# Patient Record
Sex: Female | Born: 1995 | Race: White | Hispanic: No | Marital: Married | Smoking: Never smoker
Health system: Southern US, Community
[De-identification: ages and names within clinical notes are randomized; demographics above are authoritative.]

## PROBLEM LIST (undated history)

## (undated) DIAGNOSIS — Z789 Other specified health status: Secondary | ICD-10-CM

## (undated) DIAGNOSIS — S060X9A Concussion with loss of consciousness of unspecified duration, initial encounter: Secondary | ICD-10-CM

## (undated) DIAGNOSIS — O139 Gestational [pregnancy-induced] hypertension without significant proteinuria, unspecified trimester: Secondary | ICD-10-CM

## (undated) DIAGNOSIS — O1420 HELLP syndrome (HELLP), unspecified trimester: Secondary | ICD-10-CM

## (undated) DIAGNOSIS — B019 Varicella without complication: Secondary | ICD-10-CM

## (undated) DIAGNOSIS — S060XAA Concussion with loss of consciousness status unknown, initial encounter: Secondary | ICD-10-CM

## (undated) HISTORY — DX: Other specified health status: Z78.9

## (undated) HISTORY — PX: TONSILLECTOMY: SUR1361

## (undated) HISTORY — DX: Gestational (pregnancy-induced) hypertension without significant proteinuria, unspecified trimester: O13.9

## (undated) HISTORY — DX: Varicella without complication: B01.9

---
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
## 2020-03-22 ENCOUNTER — Other Ambulatory Visit: Payer: Self-pay

## 2020-03-22 DIAGNOSIS — Z20822 Contact with and (suspected) exposure to covid-19: Secondary | ICD-10-CM

## 2020-03-23 LAB — SARS-COV-2, NAA 2 DAY TAT

## 2020-03-23 LAB — NOVEL CORONAVIRUS, NAA: SARS-CoV-2, NAA: DETECTED — AB

## 2020-07-30 NOTE — L&D Delivery Note (Addendum)
Delivery Note At 8:55 PM a non-viable female was delivered via Vaginal, Spontaneous (Presentation: breech) and en caul.  APGAR: 0, 0; weight pending.   Placenta status: Spontaneous;Pathology, Intact.  Cord:   with the following complications:  marginal insertion.  Cord pH: n/a.  Inspection of fetus revealed no abnormality.  Amniotic fluid translucent, light green in color.  Anesthesia: IV fentanyl and NO Episiotomy: None Lacerations:  none Suture Repair:  n/a Est. Blood Loss (mL):  50 cc  Mom to postpartum.  Baby to [place].  Will start magnesium sulfate recovery x 24 hours postpartum.  Continue q 6 h labs Patient given Amp/Gent x 1 dose; although suspect fever related to large doses of misoprostol.  Mitchel Honour 03/25/2021, 9:20 PM

## 2021-01-19 ENCOUNTER — Encounter: Payer: Self-pay | Admitting: Obstetrics and Gynecology

## 2021-01-27 ENCOUNTER — Other Ambulatory Visit: Payer: Self-pay | Admitting: Obstetrics and Gynecology

## 2021-02-28 ENCOUNTER — Encounter: Payer: Self-pay | Admitting: *Deleted

## 2021-03-01 ENCOUNTER — Ambulatory Visit (HOSPITAL_BASED_OUTPATIENT_CLINIC_OR_DEPARTMENT_OTHER): Payer: 59 | Admitting: Maternal & Fetal Medicine

## 2021-03-01 ENCOUNTER — Ambulatory Visit: Payer: 59 | Attending: Obstetrics and Gynecology

## 2021-03-01 ENCOUNTER — Ambulatory Visit: Payer: 59 | Admitting: *Deleted

## 2021-03-01 ENCOUNTER — Encounter: Payer: Self-pay | Admitting: Obstetrics and Gynecology

## 2021-03-01 ENCOUNTER — Encounter: Payer: Self-pay | Admitting: *Deleted

## 2021-03-01 ENCOUNTER — Other Ambulatory Visit: Payer: Self-pay | Admitting: Obstetrics and Gynecology

## 2021-03-01 ENCOUNTER — Other Ambulatory Visit: Payer: Self-pay

## 2021-03-01 ENCOUNTER — Other Ambulatory Visit: Payer: Self-pay | Admitting: *Deleted

## 2021-03-01 VITALS — BP 115/72 | HR 79 | Ht 65.0 in

## 2021-03-01 DIAGNOSIS — Z364 Encounter for antenatal screening for fetal growth retardation: Secondary | ICD-10-CM

## 2021-03-01 DIAGNOSIS — Z363 Encounter for antenatal screening for malformations: Secondary | ICD-10-CM | POA: Diagnosis not present

## 2021-03-01 DIAGNOSIS — O365920 Maternal care for other known or suspected poor fetal growth, second trimester, not applicable or unspecified: Secondary | ICD-10-CM

## 2021-03-01 DIAGNOSIS — O283 Abnormal ultrasonic finding on antenatal screening of mother: Secondary | ICD-10-CM

## 2021-03-01 DIAGNOSIS — Z3689 Encounter for other specified antenatal screening: Secondary | ICD-10-CM | POA: Diagnosis present

## 2021-03-01 DIAGNOSIS — Z3A19 19 weeks gestation of pregnancy: Secondary | ICD-10-CM

## 2021-03-01 DIAGNOSIS — O358XX0 Maternal care for other (suspected) fetal abnormality and damage, not applicable or unspecified: Secondary | ICD-10-CM

## 2021-03-01 DIAGNOSIS — O43192 Other malformation of placenta, second trimester: Secondary | ICD-10-CM | POA: Diagnosis not present

## 2021-03-01 NOTE — Progress Notes (Signed)
MFM Consultation  Brenda Mason is a 25 yo G1P0 who is here with single intrauterine pregnancy here for a detailed anatomy due new previable fetal growth restriction.  Normal anatomy with measurements consistent with fetal growth restriction with an EFW 1.1 and AC of 7% There is good fetal movement and amniotic fluid volume Suboptimal views of the fetal anatomy were obtained secondary to fetal position. The UA Dopplers demonstrate persistent AEDF no evidence of REDF.  I discussed today's visit with a diagnosis of IUGR. I explained that the etiology includes placental insufficiency, chronic disease, infection, aneuploidy and other genetic syndromes. She has a low risk NIPS. She has no additional risk factors for chronic disease or vascular disease. At this time I explained the diagnosis, evaluation and management to include on going fetal growth and weekly antenatal testing to include UA Dopplers.   However given the early gestational age and the abnormal UA Dopplers we discussed the increased risk for UA Doppler progression resulting in a previable stillbirth. We discussed the increased risk for preterm delivery and the associated preterm comorbidites at early gestational age including neurodevelopmental delays and chronic disease.   We will see her in 2 weeks to assess fetal heart rate and to complete the fetal anatomy. In 4 weeks we will repeat growth and evaluate the UA Dopplers and weight. She will be 23 weeks and will be the first option for BMZ administration and NICU consultation. I discussed this is all dependent on the weight of the fetus preferably above 400-500g  Brenda Mason has no contributing factors for today's findings, however, I did explain we at times see this in women with undiagnosed antiphospholipid antibody syndrome. We discussed the treatment of heparin and ASA in these cases. We discussed if there would be any benefit for low dose ASA. I explained that the studies have not demonstrated  benefit in early FGR however, ASA has been helpful for early preterm delivery and preeclampsia. She opted to take low dose ASA. She affirmed no allergies.  In addition, there was suspicion for echogenic bowel but the findings were not bright as bone, we discussed the association with CMV and other infection and genetic etiologies. We discussed the indication for an amniocentesis to rule out genetic causes. Given the 1:500-1:1000 risk  of perinatal loss she declined diagnostic testing but will consider at a future appointment. Understandably today' s visit was challenging and she desired to discuss with her husband before making any other decisions.  I provided my personal number if she or her husband had additional questions.  At next appointment we will drawn CMV, complete the fetal anatomy and forgo UA Dopplers unless Brenda Mason request ( We discussed not looking until we can act on them).   I spent 60 minutes with >50% in face to face consultation.  Brenda Olive, MD.

## 2021-03-20 ENCOUNTER — Ambulatory Visit: Payer: 59

## 2021-03-20 ENCOUNTER — Encounter: Payer: Self-pay | Admitting: *Deleted

## 2021-03-20 ENCOUNTER — Other Ambulatory Visit: Payer: Self-pay | Admitting: Maternal & Fetal Medicine

## 2021-03-20 ENCOUNTER — Other Ambulatory Visit: Payer: Self-pay

## 2021-03-20 ENCOUNTER — Ambulatory Visit: Payer: 59 | Attending: Obstetrics and Gynecology

## 2021-03-20 ENCOUNTER — Ambulatory Visit: Payer: 59 | Admitting: *Deleted

## 2021-03-20 VITALS — BP 132/81 | HR 98

## 2021-03-20 DIAGNOSIS — O365920 Maternal care for other known or suspected poor fetal growth, second trimester, not applicable or unspecified: Secondary | ICD-10-CM | POA: Diagnosis not present

## 2021-03-20 DIAGNOSIS — O283 Abnormal ultrasonic finding on antenatal screening of mother: Secondary | ICD-10-CM | POA: Diagnosis not present

## 2021-03-20 DIAGNOSIS — O43199 Other malformation of placenta, unspecified trimester: Secondary | ICD-10-CM

## 2021-03-20 DIAGNOSIS — Z3A21 21 weeks gestation of pregnancy: Secondary | ICD-10-CM | POA: Insufficient documentation

## 2021-03-22 ENCOUNTER — Other Ambulatory Visit: Payer: Self-pay

## 2021-03-22 ENCOUNTER — Telehealth: Payer: Self-pay | Admitting: Obstetrics and Gynecology

## 2021-03-22 LAB — TOXOPLASMA GONDII ANTIBODY, IGG: Toxoplasma IgG Ratio: <3 IU/mL (ref 0.0–7.1)

## 2021-03-22 LAB — AFP, SERUM, OPEN SPINA BIFIDA
AFP MoM: 1.47
AFP Value: 103.9 ng/mL
Gest. Age on Collection Date: 21.5 weeks
Maternal Age At EDD: 23.8 yr
OSBR Risk 1 IN: 2965
Test Results:: NEGATIVE
Weight: 155 [lb_av]

## 2021-03-22 LAB — CMV IGM: CMV IgM Ser EIA-aCnc: <30 AU/mL (ref 0.0–29.9)

## 2021-03-22 LAB — TOXOPLASMA GONDII ANTIBODY, IGM: Toxoplasma Antibody- IgM: <3 AU/mL (ref 0.0–7.9)

## 2021-03-22 LAB — INFECT DISEASE AB IGM REFLEX 1

## 2021-03-22 LAB — CMV ANTIBODY, IGG (EIA): CMV Ab - IgG: <0.6 U/mL (ref 0.00–0.59)

## 2021-03-22 NOTE — Telephone Encounter (Signed)
Brenda Mason elected to have maternal serum AFP only screening for open neural tube defects.  The results of this screening are within normal limits with a resulting risk for open spina bifida of 1 in 2,965.  This testing detects greater than 80% of open neural tube defects and when combined with second trimester ultrasound the detection is further increased. It is important to remember that not all birth defects can be detected prenatally.  In addition, testing for CMV and toxoplasmosis were ordered due to the findings of IUGR and echogenic bowel on ultrasound.  These results were negative for both IgG and IgM, showing no evidence of recent or past exposure to either of these illnesses. Prior CF carrier screening was negative per patient.  We may be reached at [phone] with any questions or concerns.  Cherly Anderson, MS, CGC

## 2021-03-24 ENCOUNTER — Inpatient Hospital Stay (HOSPITAL_COMMUNITY)
Admission: AD | Admit: 2021-03-24 | Discharge: 2021-03-27 | DRG: 806 | Disposition: A | Discharge status: Home / Self Care | Payer: 59 | Attending: Obstetrics & Gynecology | Admitting: Obstetrics & Gynecology

## 2021-03-24 ENCOUNTER — Observation Stay (HOSPITAL_BASED_OUTPATIENT_CLINIC_OR_DEPARTMENT_OTHER): Payer: 59

## 2021-03-24 ENCOUNTER — Encounter (HOSPITAL_COMMUNITY): Payer: Self-pay | Admitting: Obstetrics and Gynecology

## 2021-03-24 ENCOUNTER — Inpatient Hospital Stay (HOSPITAL_COMMUNITY): Payer: 59

## 2021-03-24 ENCOUNTER — Other Ambulatory Visit: Payer: Self-pay

## 2021-03-24 DIAGNOSIS — M5489 Other dorsalgia: Secondary | ICD-10-CM | POA: Diagnosis not present

## 2021-03-24 DIAGNOSIS — O365920 Maternal care for other known or suspected poor fetal growth, second trimester, not applicable or unspecified: Secondary | ICD-10-CM

## 2021-03-24 DIAGNOSIS — Z20822 Contact with and (suspected) exposure to covid-19: Secondary | ICD-10-CM | POA: Diagnosis present

## 2021-03-24 DIAGNOSIS — R932 Abnormal findings on diagnostic imaging of liver and biliary tract: Secondary | ICD-10-CM

## 2021-03-24 DIAGNOSIS — R1013 Epigastric pain: Secondary | ICD-10-CM | POA: Diagnosis present

## 2021-03-24 DIAGNOSIS — D696 Thrombocytopenia, unspecified: Secondary | ICD-10-CM | POA: Diagnosis present

## 2021-03-24 DIAGNOSIS — O1424 HELLP syndrome, complicating childbirth: Principal | ICD-10-CM | POA: Diagnosis present

## 2021-03-24 DIAGNOSIS — Z3A22 22 weeks gestation of pregnancy: Secondary | ICD-10-CM | POA: Diagnosis not present

## 2021-03-24 DIAGNOSIS — R1010 Upper abdominal pain, unspecified: Secondary | ICD-10-CM | POA: Diagnosis not present

## 2021-03-24 DIAGNOSIS — O321XX0 Maternal care for breech presentation, not applicable or unspecified: Secondary | ICD-10-CM

## 2021-03-24 DIAGNOSIS — O1422 HELLP syndrome (HELLP), second trimester: Secondary | ICD-10-CM | POA: Diagnosis not present

## 2021-03-24 DIAGNOSIS — O364XX0 Maternal care for intrauterine death, not applicable or unspecified: Secondary | ICD-10-CM | POA: Diagnosis present

## 2021-03-24 DIAGNOSIS — O283 Abnormal ultrasonic finding on antenatal screening of mother: Secondary | ICD-10-CM | POA: Diagnosis not present

## 2021-03-24 DIAGNOSIS — O358XX0 Maternal care for other (suspected) fetal abnormality and damage, not applicable or unspecified: Secondary | ICD-10-CM

## 2021-03-24 DIAGNOSIS — O43192 Other malformation of placenta, second trimester: Secondary | ICD-10-CM

## 2021-03-24 DIAGNOSIS — O43123 Velamentous insertion of umbilical cord, third trimester: Secondary | ICD-10-CM | POA: Diagnosis present

## 2021-03-24 DIAGNOSIS — R7401 Elevation of levels of liver transaminase levels: Secondary | ICD-10-CM

## 2021-03-24 DIAGNOSIS — O9912 Other diseases of the blood and blood-forming organs and certain disorders involving the immune mechanism complicating childbirth: Secondary | ICD-10-CM | POA: Diagnosis present

## 2021-03-24 DIAGNOSIS — M549 Dorsalgia, unspecified: Secondary | ICD-10-CM

## 2021-03-24 DIAGNOSIS — O99891 Other specified diseases and conditions complicating pregnancy: Secondary | ICD-10-CM

## 2021-03-24 DIAGNOSIS — O0990 Supervision of high risk pregnancy, unspecified, unspecified trimester: Secondary | ICD-10-CM

## 2021-03-24 DIAGNOSIS — R1011 Right upper quadrant pain: Secondary | ICD-10-CM

## 2021-03-24 DIAGNOSIS — O1420 HELLP syndrome (HELLP), unspecified trimester: Secondary | ICD-10-CM | POA: Diagnosis present

## 2021-03-24 DIAGNOSIS — O99112 Other diseases of the blood and blood-forming organs and certain disorders involving the immune mechanism complicating pregnancy, second trimester: Secondary | ICD-10-CM

## 2021-03-24 LAB — CBC WITH DIFFERENTIAL/PLATELET
Abs Immature Granulocytes: 0.22 10*3/uL — ABNORMAL HIGH (ref 0.00–0.07)
Basophils Absolute: 0 10*3/uL (ref 0.0–0.1)
Basophils Relative: 0 %
Eosinophils Absolute: 0.1 10*3/uL (ref 0.0–0.5)
Eosinophils Relative: 1 %
HCT: 34 % — ABNORMAL LOW (ref 36.0–46.0)
Hemoglobin: 12.3 g/dL (ref 12.0–15.0)
Immature Granulocytes: 2 %
Lymphocytes Relative: 16 %
Lymphs Abs: 1.9 10*3/uL (ref 0.7–4.0)
MCH: 32.6 pg (ref 26.0–34.0)
MCHC: 36.2 g/dL — ABNORMAL HIGH (ref 30.0–36.0)
MCV: 90.2 fL (ref 80.0–100.0)
Monocytes Absolute: 0.8 10*3/uL (ref 0.1–1.0)
Monocytes Relative: 7 %
Neutro Abs: 8.9 10*3/uL — ABNORMAL HIGH (ref 1.7–7.7)
Neutrophils Relative %: 74 %
Platelets: 88 10*3/uL — ABNORMAL LOW (ref 150–400)
RBC: 3.77 MIL/uL — ABNORMAL LOW (ref 3.87–5.11)
RDW: 12.2 % (ref 11.5–15.5)
WBC: 11.8 10*3/uL — ABNORMAL HIGH (ref 4.0–10.5)
nRBC: 0 % (ref 0.0–0.2)

## 2021-03-24 LAB — COMPREHENSIVE METABOLIC PANEL
ALT: 262 U/L — ABNORMAL HIGH (ref 0–44)
ALT: 291 U/L — ABNORMAL HIGH (ref 0–44)
ALT: 299 U/L — ABNORMAL HIGH (ref 0–44)
ALT: 318 U/L — ABNORMAL HIGH (ref 0–44)
ALT: 379 U/L — ABNORMAL HIGH (ref 0–44)
AST: 179 U/L — ABNORMAL HIGH (ref 15–41)
AST: 219 U/L — ABNORMAL HIGH (ref 15–41)
AST: 232 U/L — ABNORMAL HIGH (ref 15–41)
AST: 250 U/L — ABNORMAL HIGH (ref 15–41)
AST: 306 U/L — ABNORMAL HIGH (ref 15–41)
Albumin: 3.1 g/dL — ABNORMAL LOW (ref 3.5–5.0)
Albumin: 3 g/dL — ABNORMAL LOW (ref 3.5–5.0)
Albumin: 2.9 g/dL — ABNORMAL LOW (ref 3.5–5.0)
Albumin: 3 g/dL — ABNORMAL LOW (ref 3.5–5.0)
Albumin: 2.9 g/dL — ABNORMAL LOW (ref 3.5–5.0)
Alkaline Phosphatase: 119 U/L (ref 38–126)
Alkaline Phosphatase: 115 U/L (ref 38–126)
Alkaline Phosphatase: 104 U/L (ref 38–126)
Alkaline Phosphatase: 120 U/L (ref 38–126)
Alkaline Phosphatase: 114 U/L (ref 38–126)
Anion gap: 7 (ref 5–15)
Anion gap: 7 (ref 5–15)
Anion gap: 7 (ref 5–15)
Anion gap: 9 (ref 5–15)
Anion gap: 8 (ref 5–15)
BUN: 9 mg/dL (ref 6–20)
BUN: 8 mg/dL (ref 6–20)
BUN: 8 mg/dL (ref 6–20)
BUN: 6 mg/dL (ref 6–20)
BUN: 6 mg/dL (ref 6–20)
CO2: 23 mmol/L (ref 22–32)
CO2: 23 mmol/L (ref 22–32)
CO2: 23 mmol/L (ref 22–32)
CO2: 22 mmol/L (ref 22–32)
CO2: 21 mmol/L — ABNORMAL LOW (ref 22–32)
Calcium: 8.8 mg/dL — ABNORMAL LOW (ref 8.9–10.3)
Calcium: 8.7 mg/dL — ABNORMAL LOW (ref 8.9–10.3)
Calcium: 8 mg/dL — ABNORMAL LOW (ref 8.9–10.3)
Calcium: 7.5 mg/dL — ABNORMAL LOW (ref 8.9–10.3)
Calcium: 6.9 mg/dL — ABNORMAL LOW (ref 8.9–10.3)
Chloride: 105 mmol/L (ref 98–111)
Chloride: 107 mmol/L (ref 98–111)
Chloride: 105 mmol/L (ref 98–111)
Chloride: 105 mmol/L (ref 98–111)
Chloride: 103 mmol/L (ref 98–111)
Creatinine, Ser: 0.71 mg/dL (ref 0.44–1.00)
Creatinine, Ser: 0.64 mg/dL (ref 0.44–1.00)
Creatinine, Ser: 0.7 mg/dL (ref 0.44–1.00)
Creatinine, Ser: 0.65 mg/dL (ref 0.44–1.00)
Creatinine, Ser: 0.6 mg/dL (ref 0.44–1.00)
GFR, Estimated: >60 mL/min (ref >60)
GFR, Estimated: >60 mL/min (ref >60)
GFR, Estimated: >60 mL/min (ref >60)
GFR, Estimated: >60 mL/min (ref >60)
GFR, Estimated: >60 mL/min (ref >60)
Glucose, Bld: 88 mg/dL (ref 70–99)
Glucose, Bld: 82 mg/dL (ref 70–99)
Glucose, Bld: 119 mg/dL — ABNORMAL HIGH (ref 70–99)
Glucose, Bld: 112 mg/dL — ABNORMAL HIGH (ref 70–99)
Glucose, Bld: 100 mg/dL — ABNORMAL HIGH (ref 70–99)
Potassium: 4 mmol/L (ref 3.5–5.1)
Potassium: 4.1 mmol/L (ref 3.5–5.1)
Potassium: 3.7 mmol/L (ref 3.5–5.1)
Potassium: 3.7 mmol/L (ref 3.5–5.1)
Potassium: 3.4 mmol/L — ABNORMAL LOW (ref 3.5–5.1)
Sodium: 135 mmol/L (ref 135–145)
Sodium: 137 mmol/L (ref 135–145)
Sodium: 135 mmol/L (ref 135–145)
Sodium: 136 mmol/L (ref 135–145)
Sodium: 132 mmol/L — ABNORMAL LOW (ref 135–145)
Total Bilirubin: 1.4 mg/dL — ABNORMAL HIGH (ref 0.3–1.2)
Total Bilirubin: 1.9 mg/dL — ABNORMAL HIGH (ref 0.3–1.2)
Total Bilirubin: 1.9 mg/dL — ABNORMAL HIGH (ref 0.3–1.2)
Total Bilirubin: 2.4 mg/dL — ABNORMAL HIGH (ref 0.3–1.2)
Total Bilirubin: 2.7 mg/dL — ABNORMAL HIGH (ref 0.3–1.2)
Total Protein: 6 g/dL — ABNORMAL LOW (ref 6.5–8.1)
Total Protein: 5.9 g/dL — ABNORMAL LOW (ref 6.5–8.1)
Total Protein: 5.5 g/dL — ABNORMAL LOW (ref 6.5–8.1)
Total Protein: 5.8 g/dL — ABNORMAL LOW (ref 6.5–8.1)
Total Protein: 5.5 g/dL — ABNORMAL LOW (ref 6.5–8.1)

## 2021-03-24 LAB — CBC
HCT: 26.7 % — ABNORMAL LOW (ref 36.0–46.0)
HCT: 32.4 % — ABNORMAL LOW (ref 36.0–46.0)
HCT: 33 % — ABNORMAL LOW (ref 36.0–46.0)
HCT: 34.5 % — ABNORMAL LOW (ref 36.0–46.0)
Hemoglobin: 11.8 g/dL — ABNORMAL LOW (ref 12.0–15.0)
Hemoglobin: 11.8 g/dL — ABNORMAL LOW (ref 12.0–15.0)
Hemoglobin: 12.4 g/dL (ref 12.0–15.0)
Hemoglobin: 9.8 g/dL — ABNORMAL LOW (ref 12.0–15.0)
MCH: 32.4 pg (ref 26.0–34.0)
MCH: 32.4 pg (ref 26.0–34.0)
MCH: 32.5 pg (ref 26.0–34.0)
MCH: 32.9 pg (ref 26.0–34.0)
MCHC: 35.8 g/dL (ref 30.0–36.0)
MCHC: 35.9 g/dL (ref 30.0–36.0)
MCHC: 36.4 g/dL — ABNORMAL HIGH (ref 30.0–36.0)
MCHC: 36.7 g/dL — ABNORMAL HIGH (ref 30.0–36.0)
MCV: 89.3 fL (ref 80.0–100.0)
MCV: 89.6 fL (ref 80.0–100.0)
MCV: 90.1 fL (ref 80.0–100.0)
MCV: 90.7 fL (ref 80.0–100.0)
Platelets: 51 10*3/uL — ABNORMAL LOW (ref 150–400)
Platelets: 53 10*3/uL — ABNORMAL LOW (ref 150–400)
Platelets: 63 10*3/uL — ABNORMAL LOW (ref 150–400)
Platelets: 80 10*3/uL — ABNORMAL LOW (ref 150–400)
RBC: 2.98 MIL/uL — ABNORMAL LOW (ref 3.87–5.11)
RBC: 3.63 MIL/uL — ABNORMAL LOW (ref 3.87–5.11)
RBC: 3.64 MIL/uL — ABNORMAL LOW (ref 3.87–5.11)
RBC: 3.83 MIL/uL — ABNORMAL LOW (ref 3.87–5.11)
RDW: 12.2 % (ref 11.5–15.5)
RDW: 12.3 % (ref 11.5–15.5)
RDW: 12.4 % (ref 11.5–15.5)
RDW: 12.6 % (ref 11.5–15.5)
WBC: 10.5 10*3/uL (ref 4.0–10.5)
WBC: 11.6 10*3/uL — ABNORMAL HIGH (ref 4.0–10.5)
WBC: 11.7 10*3/uL — ABNORMAL HIGH (ref 4.0–10.5)
WBC: 9.6 10*3/uL (ref 4.0–10.5)
nRBC: 0 % (ref 0.0–0.2)
nRBC: 0 % (ref 0.0–0.2)
nRBC: 0 % (ref 0.0–0.2)
nRBC: 0 % (ref 0.0–0.2)

## 2021-03-24 LAB — URINALYSIS, ROUTINE W REFLEX MICROSCOPIC
Bacteria, UA: NONE SEEN
Bilirubin Urine: NEGATIVE
Glucose, UA: NEGATIVE mg/dL
Hgb urine dipstick: NEGATIVE
Ketones, ur: NEGATIVE mg/dL
Leukocytes,Ua: NEGATIVE
Nitrite: NEGATIVE
Protein, ur: NEGATIVE mg/dL
Specific Gravity, Urine: 1.011 (ref 1.005–1.030)
pH: 6 (ref 5.0–8.0)

## 2021-03-24 LAB — TYPE AND SCREEN
ABO/RH(D): B POS
Antibody Screen: NEGATIVE

## 2021-03-24 LAB — APTT: aPTT: 31 seconds (ref 24–36)

## 2021-03-24 LAB — LACTATE DEHYDROGENASE
LDH: 363 U/L — ABNORMAL HIGH (ref 98–192)
LDH: 350 U/L — ABNORMAL HIGH (ref 98–192)
LDH: 367 U/L — ABNORMAL HIGH (ref 98–192)
LDH: 445 U/L — ABNORMAL HIGH (ref 98–192)

## 2021-03-24 LAB — HEPATITIS PANEL, ACUTE
HCV Ab: NONREACTIVE
Hep A IgM: NONREACTIVE
Hep B C IgM: NONREACTIVE
Hepatitis B Surface Ag: NONREACTIVE

## 2021-03-24 LAB — RESP PANEL BY RT-PCR (FLU A&B, COVID) ARPGX2
Influenza A by PCR: NEGATIVE
Influenza B by PCR: NEGATIVE
SARS Coronavirus 2 by RT PCR: NEGATIVE

## 2021-03-24 LAB — URIC ACID: Uric Acid, Serum: 3.5 mg/dL (ref 2.5–7.1)

## 2021-03-24 LAB — AMMONIA: Ammonia: 18 umol/L (ref 9–35)

## 2021-03-24 LAB — LIPASE, BLOOD: Lipase: 34 U/L (ref 11–51)

## 2021-03-24 LAB — PROTIME-INR
INR: 1 (ref 0.8–1.2)
Prothrombin Time: 12.7 seconds (ref 11.4–15.2)

## 2021-03-24 LAB — AMYLASE: Amylase: 42 U/L (ref 28–100)

## 2021-03-24 LAB — PROTEIN / CREATININE RATIO, URINE
Creatinine, Urine: 64.56 mg/dL
Total Protein, Urine: <6 mg/dL

## 2021-03-24 LAB — ABO/RH: ABO/RH(D): B POS

## 2021-03-24 LAB — FIBRINOGEN: Fibrinogen: 372 mg/dL (ref 210–475)

## 2021-03-24 MED ORDER — LACTATED RINGERS IV SOLN: INTRAVENOUS | Status: DC

## 2021-03-24 MED ORDER — LABETALOL HCL 5 MG/ML IV SOLN: 80.0000 mg | INTRAVENOUS | Status: DC | PRN

## 2021-03-24 MED ORDER — ONDANSETRON HCL 4 MG/2ML IJ SOLN
4.0000 mg | Freq: Four times a day (QID) | INTRAMUSCULAR | Status: DC | PRN
  Administered 2021-03-25: 4 mg via INTRAVENOUS
  Filled 2021-03-24: qty 2

## 2021-03-24 MED ORDER — MAGNESIUM SULFATE 40 GM/1000ML IV SOLN
2.0000 g/h | INTRAVENOUS | Status: DC
  Administered 2021-03-24: 2 g/h via INTRAVENOUS
  Filled 2021-03-24: qty 1000

## 2021-03-24 MED ORDER — HYDRALAZINE HCL 20 MG/ML IJ SOLN: 10.0000 mg | INTRAMUSCULAR | Status: DC | PRN

## 2021-03-24 MED ORDER — FENTANYL CITRATE (PF) 100 MCG/2ML IJ SOLN
50.0000 ug | INTRAMUSCULAR | Status: DC | PRN
  Administered 2021-03-25: 50 ug via INTRAVENOUS
  Administered 2021-03-25 (×2): 100 ug via INTRAVENOUS
  Filled 2021-03-24 (×3): qty 2

## 2021-03-24 MED ORDER — LABETALOL HCL 5 MG/ML IV SOLN: 20.0000 mg | INTRAVENOUS | Status: DC | PRN

## 2021-03-24 MED ORDER — PRENATAL MULTIVITAMIN CH
1.0000 | ORAL_TABLET | Freq: Every day | ORAL | Status: DC
  Administered 2021-03-24: 1 via ORAL
  Filled 2021-03-24: qty 1

## 2021-03-24 MED ORDER — OXYCODONE-ACETAMINOPHEN 5-325 MG PO TABS: 1.0000 | ORAL_TABLET | ORAL | Status: DC | PRN

## 2021-03-24 MED ORDER — LABETALOL HCL 5 MG/ML IV SOLN: 40.0000 mg | INTRAVENOUS | Status: DC | PRN

## 2021-03-24 MED ORDER — DOCUSATE SODIUM 100 MG PO CAPS
100.0000 mg | ORAL_CAPSULE | Freq: Every day | ORAL | Status: DC
  Administered 2021-03-24: 100 mg via ORAL
  Filled 2021-03-24: qty 1

## 2021-03-24 MED ORDER — CALCIUM CARBONATE ANTACID 500 MG PO CHEW: 2.0000 | CHEWABLE_TABLET | ORAL | Status: DC | PRN

## 2021-03-24 MED ORDER — OXYTOCIN-SODIUM CHLORIDE 30-0.9 UT/500ML-% IV SOLN: 2.5000 [IU]/h | INTRAVENOUS | Status: DC

## 2021-03-24 MED ORDER — SOD CITRATE-CITRIC ACID 500-334 MG/5ML PO SOLN: 30.0000 mL | ORAL | Status: DC | PRN

## 2021-03-24 MED ORDER — OXYTOCIN BOLUS FROM INFUSION: 333.0000 mL | Freq: Once | INTRAVENOUS | Status: DC

## 2021-03-24 MED ORDER — ZOLPIDEM TARTRATE 5 MG PO TABS: 5.0000 mg | ORAL_TABLET | Freq: Every evening | ORAL | Status: DC | PRN

## 2021-03-24 MED ORDER — OXYCODONE-ACETAMINOPHEN 5-325 MG PO TABS: 2.0000 | ORAL_TABLET | ORAL | Status: DC | PRN

## 2021-03-24 MED ORDER — LACTATED RINGERS IV SOLN: 500.0000 mL | INTRAVENOUS | Status: DC | PRN

## 2021-03-24 MED ORDER — MISOPROSTOL 200 MCG PO TABS
200.0000 ug | ORAL_TABLET | ORAL | Status: AC | PRN
  Administered 2021-03-24 – 2021-03-25 (×3): 200 ug via VAGINAL
  Filled 2021-03-24 (×3): qty 1

## 2021-03-24 MED ORDER — ACETAMINOPHEN 325 MG PO TABS
650.0000 mg | ORAL_TABLET | ORAL | Status: DC | PRN
  Filled 2021-03-24: qty 2

## 2021-03-24 MED ORDER — SODIUM CHLORIDE 0.9% IV SOLUTION: Freq: Once | INTRAVENOUS | Status: AC

## 2021-03-24 MED ORDER — MAGNESIUM SULFATE BOLUS VIA INFUSION
4.0000 g | Freq: Once | INTRAVENOUS | Status: AC
  Administered 2021-03-24: 4 g via INTRAVENOUS
  Filled 2021-03-24: qty 1000

## 2021-03-24 MED ORDER — LIDOCAINE HCL (PF) 1 % IJ SOLN: 30.0000 mL | INTRAMUSCULAR | Status: DC | PRN

## 2021-03-24 NOTE — MAU Provider Note (Signed)
Chief Complaint:  Back Pain   Event Date/Time   First Provider Initiated Contact with Patient 03/24/21 0243     HPI: Brenda Mason is a 25 y.o. G1P0 at 14w2dwho presents to maternity admissions reporting pain in upper back and upper abdomen.  Pain occurs mostly at night. No acid reflux, Tums did not change the pain. Has not spoken with MD about this. . She denies LOF, vaginal bleeding, vaginal itching/burning, urinary symptoms, h/a, dizziness, n/v, diarrhea, constipation or fever/chills.    Abdominal Pain This is a recurrent problem. The current episode started in the past 7 days. The problem has been unchanged. The pain is located in the LUQ, RUQ and epigastric region. The abdominal pain radiates to the back. Pertinent negatives include no anorexia, constipation, diarrhea, dysuria, fever, frequency or myalgias. Nothing aggravates the pain. The pain is relieved by Nothing. She has tried antacids for the symptoms. The treatment provided no relief.   RN Note: Pt reports she has had mid upper back pain that started Sunday night. Pain happens at night. Pain wakes her up at night and she cant sleep. During the day the pain is better. Also c/o mid upper abd pain that radiated in the back as well.  Denies any vag bleeding or discharge  Past Medical History: Past Medical History:  Diagnosis Date   Chicken pox    Concussion     Past obstetric history: OB History  Gravida Para Term Preterm AB Living  1            SAB IAB Ectopic Multiple Live Births               # Outcome Date GA Lbr Len/2nd Weight Sex Delivery Anes PTL Lv  1 Current             Past Surgical History: Past Surgical History:  Procedure Laterality Date   ADENOIDECTOMY  2005   TONSILLECTOMY  2005    Family History: No family history on file.  Social History: Social History   Tobacco Use   Smoking status: Never   Smokeless tobacco: Never  Vaping Use   Vaping Use: Never used  Substance Use Topics   Alcohol use:  Not Currently   Drug use: No    Allergies: No Known Allergies  Meds:  Medications Prior to Admission  Medication Sig Dispense Refill Last Dose   aspirin 81 MG chewable tablet Chew 162 mg by mouth daily.      Prenatal Vit-Fe Fumarate-FA (PRENATAL MULTIVITAMIN) TABS tablet Take 1 tablet by mouth daily at 12 noon.       I have reviewed patient's Past Medical Hx, Surgical Hx, Family Hx, Social Hx, medications and allergies.   ROS:  Review of Systems  Constitutional:  Negative for fever.  Gastrointestinal:  Positive for abdominal pain. Negative for anorexia, constipation and diarrhea.  Genitourinary:  Negative for dysuria and frequency.  Musculoskeletal:  Negative for myalgias.  Other systems negative  Physical Exam  Patient Vitals for the past 24 hrs:  BP Temp Pulse Resp Height Weight  03/24/21 0231 125/68 98.1 F (36.7 C) 86 18 5\' 5"  (1.651 m) 72.6 kg   Constitutional: Well-developed, well-nourished female in no acute distress.  Cardiovascular: normal rate and rhythm Respiratory: normal effort GI: Abd soft, tender over upper abdomen, no overt Murphys sign.  Abd gravid appropriate for gestational age.   No rebound or guarding. MS: Extremities nontender, no edema, normal ROM Neurologic: Alert and oriented x 4.  GU:  Neg CVAT.  PELVIC EXAM: deferred   FHT:  155   Labs: Results for orders placed or performed during the hospital encounter of 03/24/21 (from the past 24 hour(s))  Urinalysis, Routine w reflex microscopic     Status: Abnormal   Collection Time: 03/24/21  2:34 AM  Result Value Ref Range   Color, Urine YELLOW YELLOW   APPearance HAZY (A) CLEAR   Specific Gravity, Urine 1.011 1.005 - 1.030   pH 6.0 5.0 - 8.0   Glucose, UA NEGATIVE NEGATIVE mg/dL   Hgb urine dipstick NEGATIVE NEGATIVE   Bilirubin Urine NEGATIVE NEGATIVE   Ketones, ur NEGATIVE NEGATIVE mg/dL   Protein, ur NEGATIVE NEGATIVE mg/dL   Nitrite NEGATIVE NEGATIVE   Leukocytes,Ua NEGATIVE NEGATIVE    RBC / HPF 0-5 0 - 5 RBC/hpf   WBC, UA 0-5 0 - 5 WBC/hpf   Bacteria, UA NONE SEEN NONE SEEN   Squamous Epithelial / LPF 0-5 0 - 5  CBC with Differential/Platelet     Status: Abnormal   Collection Time: 03/24/21  3:38 AM  Result Value Ref Range   WBC 11.8 (H) 4.0 - 10.5 K/uL   RBC 3.77 (L) 3.87 - 5.11 MIL/uL   Hemoglobin 12.3 12.0 - 15.0 g/dL   HCT 37.1 (L) 69.6 - 78.9 %   MCV 90.2 80.0 - 100.0 fL   MCH 32.6 26.0 - 34.0 pg   MCHC 36.2 (H) 30.0 - 36.0 g/dL   RDW 38.1 01.7 - 51.0 %   Platelets 88 (L) 150 - 400 K/uL   nRBC 0.0 0.0 - 0.2 %   Neutrophils Relative % 74 %   Neutro Abs 8.9 (H) 1.7 - 7.7 K/uL   Lymphocytes Relative 16 %   Lymphs Abs 1.9 0.7 - 4.0 K/uL   Monocytes Relative 7 %   Monocytes Absolute 0.8 0.1 - 1.0 K/uL   Eosinophils Relative 1 %   Eosinophils Absolute 0.1 0.0 - 0.5 K/uL   Basophils Relative 0 %   Basophils Absolute 0.0 0.0 - 0.1 K/uL   Immature Granulocytes 2 %   Abs Immature Granulocytes 0.22 (H) 0.00 - 0.07 K/uL  Comprehensive metabolic panel     Status: Abnormal   Collection Time: 03/24/21  3:38 AM  Result Value Ref Range   Sodium 135 135 - 145 mmol/L   Potassium 4.0 3.5 - 5.1 mmol/L   Chloride 105 98 - 111 mmol/L   CO2 23 22 - 32 mmol/L   Glucose, Bld 88 70 - 99 mg/dL   BUN 9 6 - 20 mg/dL   Creatinine, Ser 2.58 0.44 - 1.00 mg/dL   Calcium 8.8 (L) 8.9 - 10.3 mg/dL   Total Protein 6.0 (L) 6.5 - 8.1 g/dL   Albumin 3.1 (L) 3.5 - 5.0 g/dL   AST 527 (H) 15 - 41 U/L   ALT 262 (H) 0 - 44 U/L   Alkaline Phosphatase 119 38 - 126 U/L   Total Bilirubin 1.4 (H) 0.3 - 1.2 mg/dL   GFR, Estimated >78 >24 mL/min   Anion gap 7 5 - 15  Amylase     Status: None   Collection Time: 03/24/21  3:38 AM  Result Value Ref Range   Amylase 42 28 - 100 U/L  Lipase, blood     Status: None   Collection Time: 03/24/21  3:38 AM  Result Value Ref Range   Lipase 34 11 - 51 U/L       Imaging:  US Abdomen Complete  Result Date: 03/24/2021 CLINICAL DATA:  Initial  evaluation for acute mid-upper abdominal/back pain. Currently pregnant. EXAM: ABDOMEN ULTRASOUND COMPLETE COMPARISON:  None available. FINDINGS: Gallbladder: No gallstones or wall thickening visualized. No sonographic Murphy sign noted by sonographer. Common bile duct: Diameter: 3.5 mm Liver: No focal lesion identified. Area of diffusely increased echogenicity within the hepatic parenchyma of the right hepatic lobe, likely reflecting geographic fat deposition. Portal vein is patent on color Doppler imaging with normal direction of blood flow towards the liver. IVC: No abnormality visualized. Pancreas: Visualized portion unremarkable. Spleen: Size and appearance within normal limits. Right Kidney: Length: 12.4 cm. Renal echogenicity within normal limits. Mild right-sided hydronephrosis, likely related to current pregnancy. No visible nephrolithiasis. No focal renal mass. Left Kidney: Length: 12.4 cm. Renal echogenicity within normal limits. No nephrolithiasis or hydronephrosis. No focal renal mass. Abdominal aorta: No aneurysm visualized. Other findings: None. IMPRESSION: 1. Mild right-sided hydronephrosis, likely related to current pregnancy. No visible nephrolithiasis. 2. Diffuse hyperechoic echotexture within the right hepatic lobe, nonspecific, but favored to reflect geographic fat deposition. 3. Otherwise unremarkable and normal abdominal ultrasound. No other acute abnormality. Electronically Signed   By: Rise Mu M.D.   On: 03/24/2021 04:49     MAU Course/MDM: I have ordered labs and reviewed results. Transaminases and bilirubin are elevated   Platelets are low.  US showed normal gallbladder but hyperechoic area on right lobe of liver.   Consult Dr Alysia Penna with presentation, exam findings and test results.  He recommends admission and further assessment by Dr Lorane Gell who was notified and will put in admission orders and come see the patient.   Assessment: Single IUP at .[redacted]w[redacted]d Upper  abdominal pain Upper back pain Abnormal findings on liver Elevated Transaminases and bilurubin Thrombocytopenia  Plan: Admit to [facility] specialty Care unit MD to follow  Wynelle Bourgeois CNM, MSN Certified Nurse-Midwife 03/24/2021 2:43 AM

## 2021-03-24 NOTE — Progress Notes (Signed)
PLT returned at 51k.  Will give PLT transfusion (1 unit). Continue q 4 h labs Will also D/C magnesium for now given continued normal BPs to further reduce risk for hemorrhage at delivery.  Mitchel Honour, DO

## 2021-03-24 NOTE — H&P (Addendum)
Antepartum History and Physical Patient has duplicate charts. Relevant information is in [id number]  Brenda Mason is a 25 y.o. female G1P0 that presented for epigastric pain. This has been present for several days and occurs only at night time. She denies nausea, vomiting, lower abdominal pain, reflux. She denies fever, chills, change in bowel or bladder. She  has strong appetite.  Pregnancy is otherwise complicated by early and severe intrauterine growth restriction with absent EDF without reversed EDF, which has been followed with MFM (see chart [id number]). Marginal cord insertion was also noted.   Her workup otherwise includes low risk Panorama and negative CMV and toxoplasmosis workup.  FHT present this morning.   OB History     Gravida  1   Para      Term      Preterm      AB      Living         SAB      IAB      Ectopic      Multiple      Live Births             Past Medical History:  Diagnosis Date   Chicken pox    Concussion    Past Surgical History:  Procedure Laterality Date   ADENOIDECTOMY  2005   TONSILLECTOMY  2005   Family History: family history is not on file. Social History:  reports that she has never smoked. She has never used smokeless tobacco. She reports that she does not currently use alcohol. She reports that she does not use drugs.     Maternal Diabetes: No Genetic Screening: Normal Maternal Ultrasounds/Referrals: IUGR Fetal Ultrasounds or other Referrals:  Other:  fetal echo scheduled Maternal Substance Abuse:  No Significant Maternal Medications:  Meds include: Other:  ASA81 Significant Maternal Lab Results:  None Other Comments:  None  Review of Systems See HPI History   Blood pressure 118/71, pulse 99, temperature 98.3 F (36.8 C), temperature source Oral, resp. rate 19, height $RemoveBe'5\' 5"'vkbOqZNWh$  (1.651 m), weight 72.6 kg, SpO2 99 %. Exam Physical Exam  Gen: alert, well appearing, no distress Chest: nonlabored breathing CV: no  peripheral edema Abdomen: soft, epigastric pain, negative Murphy's sign Ext: no evidence of DVT  Prenatal labs: ABO, Rh: --/--/PENDING (08/26 0540) Antibody: PENDING (08/26 0540) Rubella:   RI RPR:   NR HBsAg:   neg HIV:   neg GBS:   n/a  Assessment/Plan: Admit to [facility] for supportive care, additional workup, MFM consultation. Epigastric pain with elevated liver enzymes, low platelets AST 179, ALT 262.   Alk phos WNL. PLT 88 Abdominal US 8/26: nonspecific hyperechoic region, possible fat deposition. Gallbladder normal.  Normotensive, no headache, no proteinuria Severe IUGR - most recent EFW 03/01/21 was 1.1%ile. Known AEDF without REDF. Next scheduled [place] is 9/1.  Daily dopplers Diet: NPO.  IVF at maintenance. DVT Ppx: SCDs  Unclear etiology at this time.  Additional labs ordered for HELLP and AFLP workup as well as hepatitis panel. Differential also includes adverse reaction to recent starting of ASA81.  Spoke with MFM on call Dr Gertie Exon and recommendation for lab trending and empiric magnesium sulfate for possible HELLP.    Carlyon Shadow 03/24/2021, 6:30 AM

## 2021-03-24 NOTE — Consult Note (Signed)
MFM Note  Brenda Mason is a 25 year old gravida 1 para 0 currently at 22 weeks and 2 days.  She was seen in consultation today due to possible HELLP syndrome/early onset severe preeclampsia.  The patient's pregnancy has been complicated by early onset fetal growth restriction with absent end-diastolic flow that was noted during her recent ultrasound exam performed in our office.  Echogenic bowel was also noted during that exam.  Her CMV and toxoplasmosis tests were negative for an acute infection.  She had a cell free DNA test drawn earlier in her pregnancy that indicated a low risk for trisomy 66, 52, and 13.  The patient reports that she has been experiencing mid epigastric and back pain for the past 5 days.  She denies any headaches or scotomata and denies any nausea or vomiting.  She presented to the hospital early this morning as the epigastric pain got a lot worse.  Her PIH labs on admission showed elevated liver function tests (AST 179, ALT 262) and low platelets of 88,000.  Her hemoglobin and hematocrit were normal at 12.3/34.  A repeat set of labs obtained 4 hours later, continues to show a progressive increase in her liver function tests (AST 219 ALT 291) and a decrease in her platelet count to 80,000.  Her fibrinogen levels were 372 (elevated).  Her coags, glucose levels, and uric acid levels were within normal limits.  Her blood pressures are within normal limits, between 110s to 120s over 50s to 60s.  A P/C ratio did not indicate significant proteinuria.  Her amylase and lipase levels were within normal limits.  She is afebrile.  She denies any significant past medical history and denies any history of lupus.  She had an abdominal ultrasound performed this morning that was unremarkable without any signs of gallstones.  An obstetrical ultrasound performed this morning showed an EFW of 287 g (less than the 1st percentile).  There was normal amniotic fluid noted.  The fetus was in the breech  presentation.  Fetal movements were noted throughout today's exam.  Doppler studies of the umbilical arteries showed persistent reversed end-diastolic flow.  Doppler studies of the ductus venosus could not be performed today as the fetus was curled up in a ball.  The poor prognosis for her pregnancy due to today's ultrasound findings and her abnormal blood work was discussed with the patient.  She was advised that placental dysfunction is most likely causing the IUGR and the abnormal umbilical artery Doppler studies.  She was also advised that sometimes placental dysfunction may contribute to early onset severe preeclampsia/HELLP syndrome.  At this early point in her pregnancy, the differential diagnoses of the causes of her lab findings include acute fatty liver, TTP, and an acute lupus flare.  Based on the fact that she does not appear to be sick and as all of the labs used to diagnose these conditions are essentially within normal limits, I highly doubt that any of these three conditions are the cause of her abnormal lab findings.  Just to be complete, she should have serum ammonia levels, bilirubin levels, complement levels (C3, C4), and serum LDH levels added to her blood work.  The patient was advised that should her liver function tests continue to increase and her platelet counts continue to decrease (indicating worsening HELLP syndrome), that a therapeutic termination of pregnancy as a maternal life-saving procedure may have to be considered.  She was advised that based on the EFW of 287 g and her early  gestational age, that her fetus is not considered viable at this time.  She was also advised that even with continued expectant management, due to severe placental dysfunction as evidenced by IUGR with persistent reversed end-diastolic flow in the umbilical arteries, that there is a high risk of a fetal demise that may occur within the next few days or week.    She should have a NICU consult ordered.   As the fetus is not considered viable at this time, a course of antenatal corticosteroids is not recommended.  However, a course of dexamethasone may be considered to help improve her liver function tests and platelet counts should she decide to pursue continued expectant management.  As the NICU at [facility] is not currently offering neonatal resuscitation to patients that are delivered at 22 weeks, consideration may also be given to transferring the patient to [facility] should she want an attempt at neonatal resuscitation after delivery.  However, given the low EFW, it is doubtful that resuscitation attempts will be successful.  She should be continued on magnesium sulfate for maternal seizure prophylaxis until a decision is made regarding delivery.  The patient was reassured that if this pregnancy does not result in a successful outcome, that she may have a successful outcome in her future pregnancies as each pregnancy comes with a new placenta.  She should be worked up for the antiphospholipid antibody (APA) syndrome after delivery, as there are treatment options available to help improve pregnancy outcomes in women with the APA syndrome.  The patient was extremely upset based on what was discussed with her today.  It will take her a little while to process all of the information that was discussed.    She stated that all of her questions have been answered.    Thank you for referring this patient for a Maternal-Fetal Medicine consultation.  Recommendations:  Continue to monitor her labs  Continue magnesium sulfate for maternal seizure prophylaxis   Add serum ammonia levels, bilirubin levels, complement levels (C3, C4) and LDH levels to blood work  NICU consult  Consider therapeutic termination of pregnancy as a maternal lifesaving procedure if her labs continue to worsen  Consider dexamethasone to help improve her platelet counts should expectant management be pursued or if her  platelet counts continue to decrease  Work-up for the antiphospholipid antibody syndrome after delivery

## 2021-03-24 NOTE — Progress Notes (Signed)
Unfortunately, 1130 CBC was not drawn by lab despite q 4 h order.  On return of 330 lab, PLT fell to 60k.  I recommend delivery at this time given worsening HELLP syndrome.  Patient continues to have no symptoms such as HA, vision change, CP/SOB.  Abdominal pain is improved at this time. Patient was previously offered IOL vs D&E at another facility able to perform and C/S (although I do not recommend this option).  Pros and cons for each were described and patient wishes to reduce risk to herself.  Patient has expressed desire for IOL.  She understands given her thromocytopenia, she will be unable to receive CLEA and she accepts this.  She is counseled that we have multiple pain medications, NO and anxiolytics that will be available to her.  She is also offered intermittent Doppler given no need for CEFM and patient declines.  She is counseled re: plan to use VMP for IOL and all questions were answered.  Will move to L&D to initiate IOL.  Will consider d/c magnesium given risk of hemorrhage.  Also, at next lab draw at 730pm, if PLT fall below 50k, will transfuse PLT.  Dr. Parke Poisson feels this is better tx rather than IVIG or dexamethasone.  Will have TXA ready at delivery.    Mitchel Honour, DO

## 2021-03-24 NOTE — Plan of Care (Signed)

## 2021-03-24 NOTE — Progress Notes (Signed)
Brenda Mason is a 25 y.o. G1P0 at [redacted]w[redacted]d by ultrasound admitted for IOL secondary to HELLP  Subjective: No c/o.  Feeling mild cramping.  No HA, vision change, RUQ pain, CP/SOB  Objective: BP 118/66   Pulse 98   Temp 99.3 F (37.4 C) (Oral)   Resp 18   Ht 5\' 5"  (1.651 m)   Wt 72.6 kg   SpO2 98%   BMI 26.63 kg/m  I/O last 3 completed shifts: In: 1931.2 [P.O.:360; I.V.:1571.2] Out: 2200 [Urine:2200] Total I/O In: 360 [P.O.:360] Out: 700 [Urine:700]  SVE:   Dilation: Closed Effacement (%): 50 Station: Ballotable Exam by:: Dr. [email or web address]  Labs: Lab Results  Component Value Date   WBC 11.6 (H) 03/24/2021   HGB 11.8 (L) 03/24/2021   HCT 32.4 (L) 03/24/2021   MCV 89.3 03/24/2021   PLT 51 (L) 03/24/2021    Assessment / Plan: Induction of labor for HELLP Syndrome  Labor: Progressing normally Preeclampsia:   magnesium discontinued as BPs have always been normal and in an attempt to reduce blood loss at delivery .  Platelet transfusion just started.  Next set of labs due at 1130. Fetal Wellbeing:   n/a Pain Control:  Labor support without medications I/D:  n/a Anticipated MOD:  NSVD  03/26/2021 03/24/2021, 10:04 PM

## 2021-03-24 NOTE — Progress Notes (Addendum)
Patient was seen following MFM consult.  She was tearful and expressed understanding of recommendations made by Dr. Parke Poisson.  Specifically, at this time, we are planning to follow patient's labs.  Should they continue to trend downward, induction of labor will be recommended for maternal indications (suspect HELLP Syndrome).  Will consider dexamethasone to improve PLT with anticipated blood loss.  NICU consult has been called and I spoke directly with Dr. Joycelyn Man.  Given early GA and low EFW at <1%, no interventions will be performed.  Once NICU consult has been completed, will discuss possible tx to [facility] as they consider intervention earlier but they may not accept transfer given low EFW and REDF.  Patient reports upper abdominal pain has resolved.  It only occurs at night when laying supine.  Will continue to closely monitor and continue magnesium gtt.    Mitchel Honour, DO

## 2021-03-24 NOTE — Consult Note (Signed)
[place]  Consultation Service: Neonatology   Dr. Linda Hedges has asked for consultation on MYAH GUYNES regarding the care of a premature infant at [redacted]w[redacted]d. Thank you for inviting [place] to see this patient.   Reason for consult:  Explain the possible complications, the prognosis, and the care of a premature infant at 28 and 2/7 weeks.  Chief complaint: 25 y.o. female with a singleton female IUP named "Child psychotherapist". Pregnancy has been complicated by  HELLP syndrome and IUGR <1st% with reversal of EDF .  Plan is for likely a previable IOL due to maternal HELLP labs however mom is hopeful that her labs will improve and she will make it to a viable pregnancy.   My key findings of this patient's HPI are:  I have reviewed the patient's chart and have met with her. The salient information is as follows:   Prenatal labs: ABO, Rh: --/--/PENDING (08/26 0540) Antibody: PENDING (08/26 0540) Rubella:   RI RPR:   NR HBsAg:   neg HIV:   neg GBS:   n/a  Prenatal care:   good Pregnancy complications:  HELLP syndrome, IGUR and negative aneuploidy screening although mom refused amniocentesis  Maternal antibiotics: This patient's mother is not on file. Maternal Steroids: N/a Most recent dose:  N/a    My recommendations for this patient and my actions included:   In the presence of the ROLA LENNON and her husband, I spent 30 minutes discussing the possible complications and outcomes of prematurity at this gestational age. I that discussed that unfortunately, Luana Shu is currently pre-viable and resuscitative efforts at this gestational age are not offered. I discussed that at 23 weeks resuscitative efforts would be offered however I suspect that, given his growth restriction and gestational age, his overall chance of survival even at 23 weeks would be poor. In addition I informed them that if Baker were to survive, his chance of having profound or severe neurodevelopmental impairment would  also be very high. 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 She states that if resuscitatives would be offered, she would like a trial of intensive care. I informed her that we could talk closer to the time about the specifics involved in neonatal care of a 23 week infant (RDS, risk of IVH, need for central access and feeding tubes, risk of infection, ROP, etc) if she felt that would be helpful. However, at this time, mom (very tearful) agreed that this information is unnecessary. She expressed an understanding of this and stated that she would reach out if additional questions arose.    Final Impression:  25 y.o. female with a [redacted]w[redacted]d IUP who is threatening to deliver and who now understands the possible complications and prognosis of her pre-viable infant.  Yazhini C Capriotti's questions were answered.    ______________________________________________________________________  Thank you for asking [place] to participate in the care of this patient. Please do not hesitate to contact us again if you are aware of any further ways we can be of assistance.   Sincerely,  Edman Circle, MD Attending Neonatologist   I spent ~40 minutes in consultation time, of which 30 minutes was spent in direct face to face counseling.

## 2021-03-24 NOTE — MAU Note (Signed)
Pt reports she has had mid upper back pain that started Sunday night. Pain happens at night. Pain wakes her up at night and she cant sleep. During the day the pain is better. Also c/o mid upper abd pain that radiated in the back as well.  Denies any vag bleeding or discharge.

## 2021-03-25 ENCOUNTER — Encounter (HOSPITAL_COMMUNITY): Payer: Self-pay | Admitting: Obstetrics & Gynecology

## 2021-03-25 ENCOUNTER — Other Ambulatory Visit: Payer: Self-pay | Admitting: Obstetrics & Gynecology

## 2021-03-25 LAB — BPAM PLATELET PHERESIS
Blood Product Expiration Date: 202208282359
ISSUE DATE / TIME: 202208262113
Unit Type and Rh: 5100

## 2021-03-25 LAB — COMPREHENSIVE METABOLIC PANEL
ALT: 222 U/L — ABNORMAL HIGH (ref 0–44)
ALT: 249 U/L — ABNORMAL HIGH (ref 0–44)
ALT: 274 U/L — ABNORMAL HIGH (ref 0–44)
ALT: 305 U/L — ABNORMAL HIGH (ref 0–44)
ALT: 326 U/L — ABNORMAL HIGH (ref 0–44)
AST: 111 U/L — ABNORMAL HIGH (ref 15–41)
AST: 133 U/L — ABNORMAL HIGH (ref 15–41)
AST: 161 U/L — ABNORMAL HIGH (ref 15–41)
AST: 234 U/L — ABNORMAL HIGH (ref 15–41)
AST: 254 U/L — ABNORMAL HIGH (ref 15–41)
Albumin: 2.3 g/dL — ABNORMAL LOW (ref 3.5–5.0)
Albumin: 2.7 g/dL — ABNORMAL LOW (ref 3.5–5.0)
Albumin: 2.7 g/dL — ABNORMAL LOW (ref 3.5–5.0)
Albumin: 2.8 g/dL — ABNORMAL LOW (ref 3.5–5.0)
Albumin: 2.8 g/dL — ABNORMAL LOW (ref 3.5–5.0)
Alkaline Phosphatase: 104 U/L (ref 38–126)
Alkaline Phosphatase: 106 U/L (ref 38–126)
Alkaline Phosphatase: 109 U/L (ref 38–126)
Alkaline Phosphatase: 111 U/L (ref 38–126)
Alkaline Phosphatase: 80 U/L (ref 38–126)
Anion gap: 6 (ref 5–15)
Anion gap: 7 (ref 5–15)
Anion gap: 7 (ref 5–15)
Anion gap: 8 (ref 5–15)
Anion gap: 9 (ref 5–15)
BUN: 5 mg/dL — ABNORMAL LOW (ref 6–20)
BUN: 5 mg/dL — ABNORMAL LOW (ref 6–20)
BUN: 5 mg/dL — ABNORMAL LOW (ref 6–20)
BUN: 6 mg/dL (ref 6–20)
BUN: 7 mg/dL (ref 6–20)
CO2: 19 mmol/L — ABNORMAL LOW (ref 22–32)
CO2: 19 mmol/L — ABNORMAL LOW (ref 22–32)
CO2: 19 mmol/L — ABNORMAL LOW (ref 22–32)
CO2: 20 mmol/L — ABNORMAL LOW (ref 22–32)
CO2: 21 mmol/L — ABNORMAL LOW (ref 22–32)
Calcium: 6.7 mg/dL — ABNORMAL LOW (ref 8.9–10.3)
Calcium: 7.3 mg/dL — ABNORMAL LOW (ref 8.9–10.3)
Calcium: 7.6 mg/dL — ABNORMAL LOW (ref 8.9–10.3)
Calcium: 7.9 mg/dL — ABNORMAL LOW (ref 8.9–10.3)
Calcium: 8 mg/dL — ABNORMAL LOW (ref 8.9–10.3)
Chloride: 104 mmol/L (ref 98–111)
Chloride: 104 mmol/L (ref 98–111)
Chloride: 104 mmol/L (ref 98–111)
Chloride: 104 mmol/L (ref 98–111)
Chloride: 107 mmol/L (ref 98–111)
Creatinine, Ser: 0.52 mg/dL (ref 0.44–1.00)
Creatinine, Ser: 0.58 mg/dL (ref 0.44–1.00)
Creatinine, Ser: 0.64 mg/dL (ref 0.44–1.00)
Creatinine, Ser: 0.64 mg/dL (ref 0.44–1.00)
Creatinine, Ser: 0.65 mg/dL (ref 0.44–1.00)
GFR, Estimated: >60 mL/min (ref >60)
GFR, Estimated: >60 mL/min (ref >60)
GFR, Estimated: >60 mL/min (ref >60)
GFR, Estimated: >60 mL/min (ref >60)
GFR, Estimated: >60 mL/min (ref >60)
Glucose, Bld: 119 mg/dL — ABNORMAL HIGH (ref 70–99)
Glucose, Bld: 77 mg/dL (ref 70–99)
Glucose, Bld: 82 mg/dL (ref 70–99)
Glucose, Bld: 88 mg/dL (ref 70–99)
Glucose, Bld: 92 mg/dL (ref 70–99)
Potassium: 3.6 mmol/L (ref 3.5–5.1)
Potassium: 3.7 mmol/L (ref 3.5–5.1)
Potassium: 3.9 mmol/L (ref 3.5–5.1)
Potassium: 3.9 mmol/L (ref 3.5–5.1)
Potassium: 3.9 mmol/L (ref 3.5–5.1)
Sodium: 130 mmol/L — ABNORMAL LOW (ref 135–145)
Sodium: 131 mmol/L — ABNORMAL LOW (ref 135–145)
Sodium: 132 mmol/L — ABNORMAL LOW (ref 135–145)
Sodium: 132 mmol/L — ABNORMAL LOW (ref 135–145)
Sodium: 133 mmol/L — ABNORMAL LOW (ref 135–145)
Total Bilirubin: 1.3 mg/dL — ABNORMAL HIGH (ref 0.3–1.2)
Total Bilirubin: 1.6 mg/dL — ABNORMAL HIGH (ref 0.3–1.2)
Total Bilirubin: 1.6 mg/dL — ABNORMAL HIGH (ref 0.3–1.2)
Total Bilirubin: 1.7 mg/dL — ABNORMAL HIGH (ref 0.3–1.2)
Total Bilirubin: 1.9 mg/dL — ABNORMAL HIGH (ref 0.3–1.2)
Total Protein: 4.6 g/dL — ABNORMAL LOW (ref 6.5–8.1)
Total Protein: 5.3 g/dL — ABNORMAL LOW (ref 6.5–8.1)
Total Protein: 5.5 g/dL — ABNORMAL LOW (ref 6.5–8.1)
Total Protein: 5.5 g/dL — ABNORMAL LOW (ref 6.5–8.1)
Total Protein: 5.5 g/dL — ABNORMAL LOW (ref 6.5–8.1)

## 2021-03-25 LAB — CBC WITH DIFFERENTIAL/PLATELET
Abs Immature Granulocytes: 0.11 10*3/uL — ABNORMAL HIGH (ref 0.00–0.07)
Abs Immature Granulocytes: 0.14 10*3/uL — ABNORMAL HIGH (ref 0.00–0.07)
Abs Immature Granulocytes: 0.16 10*3/uL — ABNORMAL HIGH (ref 0.00–0.07)
Basophils Absolute: 0 10*3/uL (ref 0.0–0.1)
Basophils Absolute: 0 10*3/uL (ref 0.0–0.1)
Basophils Absolute: 0.1 10*3/uL (ref 0.0–0.1)
Basophils Relative: 0 %
Basophils Relative: 0 %
Basophils Relative: 0 %
Eosinophils Absolute: 0.1 10*3/uL (ref 0.0–0.5)
Eosinophils Absolute: 0.1 10*3/uL (ref 0.0–0.5)
Eosinophils Absolute: 0.1 10*3/uL (ref 0.0–0.5)
Eosinophils Relative: 1 %
Eosinophils Relative: 1 %
Eosinophils Relative: 1 %
HCT: 30.6 % — ABNORMAL LOW (ref 36.0–46.0)
HCT: 30.1 % — ABNORMAL LOW (ref 36.0–46.0)
HCT: 31.4 % — ABNORMAL LOW (ref 36.0–46.0)
Hemoglobin: 11.1 g/dL — ABNORMAL LOW (ref 12.0–15.0)
Hemoglobin: 11 g/dL — ABNORMAL LOW (ref 12.0–15.0)
Hemoglobin: 11.2 g/dL — ABNORMAL LOW (ref 12.0–15.0)
Immature Granulocytes: 1 %
Immature Granulocytes: 1 %
Immature Granulocytes: 1 %
Lymphocytes Relative: 22 %
Lymphocytes Relative: 22 %
Lymphocytes Relative: 21 %
Lymphs Abs: 2.2 10*3/uL (ref 0.7–4.0)
Lymphs Abs: 2.5 10*3/uL (ref 0.7–4.0)
Lymphs Abs: 2.6 10*3/uL (ref 0.7–4.0)
MCH: 32.4 pg (ref 26.0–34.0)
MCH: 32.6 pg (ref 26.0–34.0)
MCH: 31.9 pg (ref 26.0–34.0)
MCHC: 36.3 g/dL — ABNORMAL HIGH (ref 30.0–36.0)
MCHC: 36.5 g/dL — ABNORMAL HIGH (ref 30.0–36.0)
MCHC: 35.7 g/dL (ref 30.0–36.0)
MCV: 89.2 fL (ref 80.0–100.0)
MCV: 89.3 fL (ref 80.0–100.0)
MCV: 89.5 fL (ref 80.0–100.0)
Monocytes Absolute: 0.6 10*3/uL (ref 0.1–1.0)
Monocytes Absolute: 0.7 10*3/uL (ref 0.1–1.0)
Monocytes Absolute: 0.7 10*3/uL (ref 0.1–1.0)
Monocytes Relative: 7 %
Monocytes Relative: 7 %
Monocytes Relative: 6 %
Neutro Abs: 6.7 10*3/uL (ref 1.7–7.7)
Neutro Abs: 7.8 10*3/uL — ABNORMAL HIGH (ref 1.7–7.7)
Neutro Abs: 8.7 10*3/uL — ABNORMAL HIGH (ref 1.7–7.7)
Neutrophils Relative %: 69 %
Neutrophils Relative %: 69 %
Neutrophils Relative %: 71 %
Platelets: 57 10*3/uL — ABNORMAL LOW (ref 150–400)
Platelets: 85 10*3/uL — ABNORMAL LOW (ref 150–400)
Platelets: 89 10*3/uL — ABNORMAL LOW (ref 150–400)
RBC: 3.43 MIL/uL — ABNORMAL LOW (ref 3.87–5.11)
RBC: 3.37 MIL/uL — ABNORMAL LOW (ref 3.87–5.11)
RBC: 3.51 MIL/uL — ABNORMAL LOW (ref 3.87–5.11)
RDW: 12.4 % (ref 11.5–15.5)
RDW: 12.2 % (ref 11.5–15.5)
RDW: 12.1 % (ref 11.5–15.5)
WBC: 9.7 10*3/uL (ref 4.0–10.5)
WBC: 11.3 10*3/uL — ABNORMAL HIGH (ref 4.0–10.5)
WBC: 12.3 10*3/uL — ABNORMAL HIGH (ref 4.0–10.5)
nRBC: 0 % (ref 0.0–0.2)
nRBC: 0 % (ref 0.0–0.2)
nRBC: 0 % (ref 0.0–0.2)

## 2021-03-25 LAB — CBC
HCT: 30.5 % — ABNORMAL LOW (ref 36.0–46.0)
Hemoglobin: 10.9 g/dL — ABNORMAL LOW (ref 12.0–15.0)
MCH: 31.8 pg (ref 26.0–34.0)
MCHC: 35.7 g/dL (ref 30.0–36.0)
MCV: 88.9 fL (ref 80.0–100.0)
Platelets: 97 10*3/uL — ABNORMAL LOW (ref 150–400)
RBC: 3.43 MIL/uL — ABNORMAL LOW (ref 3.87–5.11)
RDW: 12.1 % (ref 11.5–15.5)
WBC: 15.2 10*3/uL — ABNORMAL HIGH (ref 4.0–10.5)
nRBC: 0 % (ref 0.0–0.2)

## 2021-03-25 LAB — PREPARE PLATELET PHERESIS: Unit division: 0

## 2021-03-25 LAB — C3 COMPLEMENT: C3 Complement: 131 mg/dL (ref 82–167)

## 2021-03-25 LAB — PROTIME-INR
INR: 1 (ref 0.8–1.2)
Prothrombin Time: 13 seconds (ref 11.4–15.2)

## 2021-03-25 LAB — C4 COMPLEMENT: Complement C4, Body Fluid: 21 mg/dL (ref 12–38)

## 2021-03-25 LAB — FIBRINOGEN: Fibrinogen: 413 mg/dL (ref 210–475)

## 2021-03-25 LAB — APTT: aPTT: 35 seconds (ref 24–36)

## 2021-03-25 MED ORDER — IBUPROFEN 600 MG PO TABS
600.0000 mg | ORAL_TABLET | Freq: Four times a day (QID) | ORAL | Status: DC
  Administered 2021-03-26 – 2021-03-27 (×3): 600 mg via ORAL
  Filled 2021-03-25 (×5): qty 1

## 2021-03-25 MED ORDER — SODIUM CHLORIDE 0.9% IV SOLUTION: Freq: Once | INTRAVENOUS | Status: DC

## 2021-03-25 MED ORDER — DIPHENHYDRAMINE HCL 25 MG PO CAPS: 25.0000 mg | ORAL_CAPSULE | Freq: Four times a day (QID) | ORAL | Status: DC | PRN

## 2021-03-25 MED ORDER — DIBUCAINE (PERIANAL) 1 % EX OINT: 1.0000 "application " | TOPICAL_OINTMENT | CUTANEOUS | Status: DC | PRN

## 2021-03-25 MED ORDER — ACETAMINOPHEN 500 MG PO TABS
1000.0000 mg | ORAL_TABLET | Freq: Four times a day (QID) | ORAL | Status: DC | PRN
  Administered 2021-03-25: 1000 mg via ORAL
  Filled 2021-03-25: qty 2

## 2021-03-25 MED ORDER — TETANUS-DIPHTH-ACELL PERTUSSIS 5-2.5-18.5 LF-MCG/0.5 IM SUSY
0.5000 mL | PREFILLED_SYRINGE | Freq: Once | INTRAMUSCULAR | Status: DC
Start: 2021-03-26 — End: 2021-03-27

## 2021-03-25 MED ORDER — MAGNESIUM SULFATE 40 GM/1000ML IV SOLN
2.0000 g/h | INTRAVENOUS | Status: AC
  Filled 2021-03-25: qty 1000

## 2021-03-25 MED ORDER — SENNOSIDES-DOCUSATE SODIUM 8.6-50 MG PO TABS
2.0000 | ORAL_TABLET | Freq: Every day | ORAL | Status: DC
  Administered 2021-03-26: 2 via ORAL
  Filled 2021-03-25: qty 2

## 2021-03-25 MED ORDER — TRANEXAMIC ACID-NACL 1000-0.7 MG/100ML-% IV SOLN
INTRAVENOUS | Status: AC
  Administered 2021-03-25: 1000 mg
  Filled 2021-03-25: qty 100

## 2021-03-25 MED ORDER — ZOLPIDEM TARTRATE 5 MG PO TABS: 5.0000 mg | ORAL_TABLET | Freq: Every evening | ORAL | Status: DC | PRN

## 2021-03-25 MED ORDER — WITCH HAZEL-GLYCERIN EX PADS: 1.0000 "application " | MEDICATED_PAD | CUTANEOUS | Status: DC | PRN

## 2021-03-25 MED ORDER — SODIUM CHLORIDE 0.9 % IV SOLN
2.0000 g | Freq: Four times a day (QID) | INTRAVENOUS | Status: DC
  Administered 2021-03-25: 2 g via INTRAVENOUS
  Filled 2021-03-25: qty 2000

## 2021-03-25 MED ORDER — MISOPROSTOL 200 MCG PO TABS
ORAL_TABLET | ORAL | Status: AC
  Filled 2021-03-25: qty 1

## 2021-03-25 MED ORDER — PRENATAL MULTIVITAMIN CH
1.0000 | ORAL_TABLET | Freq: Every day | ORAL | Status: DC
  Administered 2021-03-26: 1 via ORAL
  Filled 2021-03-25: qty 1

## 2021-03-25 MED ORDER — SIMETHICONE 80 MG PO CHEW
80.0000 mg | CHEWABLE_TABLET | ORAL | Status: DC | PRN
  Filled 2021-03-25: qty 1

## 2021-03-25 MED ORDER — MAGNESIUM SULFATE BOLUS VIA INFUSION
4.0000 g | Freq: Once | INTRAVENOUS | Status: AC
  Administered 2021-03-25: 4 g via INTRAVENOUS
  Filled 2021-03-25: qty 1000

## 2021-03-25 MED ORDER — MISOPROSTOL 200 MCG PO TABS
200.0000 ug | ORAL_TABLET | ORAL | Status: DC | PRN
Start: 2021-03-25 — End: 2021-03-25
  Administered 2021-03-25 (×2): 200 ug via VAGINAL
  Filled 2021-03-25: qty 1

## 2021-03-25 MED ORDER — OXYCODONE-ACETAMINOPHEN 5-325 MG PO TABS: 1.0000 | ORAL_TABLET | ORAL | Status: DC | PRN

## 2021-03-25 MED ORDER — ONDANSETRON HCL 4 MG PO TABS: 4.0000 mg | ORAL_TABLET | ORAL | Status: DC | PRN

## 2021-03-25 MED ORDER — COCONUT OIL OIL: 1.0000 "application " | TOPICAL_OIL | Status: DC | PRN

## 2021-03-25 MED ORDER — GENTAMICIN SULFATE 40 MG/ML IJ SOLN
5.0000 mg/kg | INTRAVENOUS | Status: DC
  Administered 2021-03-25: 320 mg via INTRAVENOUS
  Filled 2021-03-25: qty 8

## 2021-03-25 MED ORDER — BENZOCAINE-MENTHOL 20-0.5 % EX AERO: 1.0000 "application " | INHALATION_SPRAY | CUTANEOUS | Status: DC | PRN

## 2021-03-25 MED ORDER — OXYCODONE-ACETAMINOPHEN 5-325 MG PO TABS: 2.0000 | ORAL_TABLET | ORAL | Status: DC | PRN

## 2021-03-25 MED ORDER — MISOPROSTOL 200 MCG PO TABS
400.0000 ug | ORAL_TABLET | ORAL | Status: DC
  Administered 2021-03-25 (×2): 400 ug via ORAL
  Filled 2021-03-25 (×2): qty 2

## 2021-03-25 MED ORDER — ONDANSETRON HCL 4 MG/2ML IJ SOLN: 4.0000 mg | INTRAMUSCULAR | Status: DC | PRN

## 2021-03-25 NOTE — Progress Notes (Signed)
Brenda Mason is a 25 y.o. G1P0 at [redacted]w[redacted]d by ultrasound admitted for induction of labor due to HELLP syndrome.  Subjective: No HA, vision change, RUQ pain, CP/SOB.  Feeling more pressure and cramping.  No VB or LOF.  Objective: BP 132/76 (BP Location: Right Arm)   Pulse 79   Temp 99.3 F (37.4 C) (Oral)   Resp 18   Ht 5\' 5"  (1.651 m)   Wt 72.6 kg   SpO2 97%   BMI 26.63 kg/m  I/O last 3 completed shifts: In: 1931.2 [P.O.:360; I.V.:1571.2] Out: 2200 [Urine:2200] Total I/O In: 516 [P.O.:360; Blood:156] Out: 1250 [Urine:1250]  FHT:  n/a UC:   n/a SVE:   Dilation: Fingertip Effacement (%): 60 Station: Ballotable Exam by:: Dr. [email or web address]  Labs: Lab Results  Component Value Date   WBC 9.7 03/25/2021   HGB 11.1 (L) 03/25/2021   HCT 30.6 (L) 03/25/2021   MCV 89.2 03/25/2021   PLT 57 (L) 03/25/2021   CBC Latest Ref Rng & Units 03/25/2021 03/24/2021 03/24/2021  WBC 4.0 - 10.5 K/uL 9.7 9.6 11.6(H)  Hemoglobin 12.0 - 15.0 g/dL 11.1(L) 9.8(L) 11.8(L)  Hematocrit 36.0 - 46.0 % 30.6(L) 26.7(L) 32.4(L)  Platelets 150 - 400 K/uL 57(L) 53(L) 51(L)   CMP Latest Ref Rng & Units 03/25/2021 03/24/2021 03/24/2021  Glucose 70 - 99 mg/dL 82 88 03/26/2021)  BUN 6 - 20 mg/dL 7 5(L) 6  Creatinine 024(O - 1.00 mg/dL 9.73 5.32 9.92  Sodium 135 - 145 mmol/L 132(L) 132(L) 132(L)  Potassium 3.5 - 5.1 mmol/L 3.7 3.6 3.4(L)  Chloride 98 - 111 mmol/L 104 104 103  CO2 22 - 32 mmol/L 21(L) 19(L) 21(L)  Calcium 8.9 - 10.3 mg/dL 7.3(L) 6.7(L) 6.9(L)  Total Protein 6.5 - 8.1 g/dL 5.3(L) 4.6(L) 5.5(L)  Total Bilirubin 0.3 - 1.2 mg/dL 4.26) 8.3(M) 2.7(H)  Alkaline Phos 38 - 126 U/L 106 80 114  AST 15 - 41 U/L 234(H) 254(H) 306(H)  ALT 0 - 44 U/L 326(H) 305(H) 379(H)     Assessment / Plan: Induction of labor for HELLP, s/p VMP #4  Labor: Progressing normally Preeclampsia:   Discontinue magnesium yesterday for low BPs and to reduce chance of hemorrhage.  Will CTM BPs and start back prn.  LFTs are stable but  continue to be elevated.  PLT this morning 57K s/p 1 u PLT transfused.  Will transfuse second unit.  Labs spaced to q 6 h.   Coags wnl. C3 and C4 pending. Fetal Wellbeing:  n/a Pain Control:  Labor support without medications I/D:  n/a Anticipated MOD:  NSVD  1.9(Q 03/25/2021, 6:32 AM

## 2021-03-25 NOTE — Progress Notes (Signed)
Patient is feeling more pain after 400 mcg dose and just received second.  No VB.   T 101.4 at 1715.  Tylenol given.  Suspect misoprostol as source but will start Amp/Gent in case of chorio. AST 161->133 ALT 274->249 PLT 85->89 (s/p 2 u PLT) Patient is offered IV narcotics and declines at this time.  Mitchel Honour, DO

## 2021-03-25 NOTE — Progress Notes (Addendum)
Brenda Mason is a 25 y.o. G1P0 at [redacted]w[redacted]d by ultrasound admitted for induction of labor due to HELLP syndrome.  Subjective: No HA, vision change, RUQ pain, CP/SOB.  Is having vaginal soreness secondary to VMP 200 mcg x 5 doses.  Feeling mild CTX and low back pain.  Objective: BP 110/68   Pulse 83   Temp (!) 100.6 F (38.1 C) (Axillary)   Resp 16   Ht 5\' 5"  (1.651 m)   Wt 72.6 kg   SpO2 100%   BMI 26.63 kg/m  I/O last 3 completed shifts: In: 2447.2 [P.O.:720; I.V.:1571.2; Blood:156] Out: 4150 [Urine:4150] Total I/O In: 741.3 [I.V.:384.9; Blood:356.4] Out: 1000 [Urine:1000]  FHT:  n/a UC:   not monitoring SVE:   Dilation: Fingertip Effacement (%): 50 Station: Ballotable Exam by:: Dr. [email or web address]  Labs: Lab Results  Component Value Date   WBC 11.3 (H) 03/25/2021   HGB 11.0 (L) 03/25/2021   HCT 30.1 (L) 03/25/2021   MCV 89.3 03/25/2021   PLT 85 (L) 03/25/2021    Assessment / Plan: Induction of labor due to HELLP syndrome  Labor:  s/p VMP 200 mcg x 5 doses.  Will change to 400 mcg q 3 h x 2 doses.  If not delivered on this regimen, will rest x 12 hours and restart.  T 100.6 at 1316 and suspect secondary to large doses of misoprostol.  WBC 11.3 and stable.  Will CTM. Preeclampsia:   HELLP syndrome-s/p 2 u PLT transfusion with last PLT 85k.  Next labs due at 1700.  Will transfuse again if <50k.  LFTs down from last draw: AST 234->161 and ALT 326->274.  Continues to be normotensive.  Magnesium discontinued last night. Fetal Wellbeing:   n/a Pain Control:  Labor support without medications I/D:  n/a Anticipated MOD:  NSVD  03/27/2021 03/25/2021, 2:35 PM

## 2021-03-26 LAB — CBC
HCT: 29.5 % — ABNORMAL LOW (ref 36.0–46.0)
Hemoglobin: 10.6 g/dL — ABNORMAL LOW (ref 12.0–15.0)
MCH: 32.1 pg (ref 26.0–34.0)
MCHC: 35.9 g/dL (ref 30.0–36.0)
MCV: 89.4 fL (ref 80.0–100.0)
Platelets: 105 10*3/uL — ABNORMAL LOW (ref 150–400)
RBC: 3.3 MIL/uL — ABNORMAL LOW (ref 3.87–5.11)
RDW: 12.3 % (ref 11.5–15.5)
WBC: 13 10*3/uL — ABNORMAL HIGH (ref 4.0–10.5)
nRBC: 0 % (ref 0.0–0.2)

## 2021-03-26 LAB — COMPREHENSIVE METABOLIC PANEL
ALT: 189 U/L — ABNORMAL HIGH (ref 0–44)
AST: 83 U/L — ABNORMAL HIGH (ref 15–41)
Albumin: 2.5 g/dL — ABNORMAL LOW (ref 3.5–5.0)
Alkaline Phosphatase: 101 U/L (ref 38–126)
Anion gap: 10 (ref 5–15)
BUN: <5 mg/dL — ABNORMAL LOW (ref 6–20)
CO2: 21 mmol/L — ABNORMAL LOW (ref 22–32)
Calcium: 7.5 mg/dL — ABNORMAL LOW (ref 8.9–10.3)
Chloride: 104 mmol/L (ref 98–111)
Creatinine, Ser: 0.52 mg/dL (ref 0.44–1.00)
GFR, Estimated: >60 mL/min (ref >60)
Glucose, Bld: 97 mg/dL (ref 70–99)
Potassium: 3.8 mmol/L (ref 3.5–5.1)
Sodium: 135 mmol/L (ref 135–145)
Total Bilirubin: 0.7 mg/dL (ref 0.3–1.2)
Total Protein: 5 g/dL — ABNORMAL LOW (ref 6.5–8.1)

## 2021-03-26 LAB — BPAM PLATELET PHERESIS
Blood Product Expiration Date: 202208292359
ISSUE DATE / TIME: 202208270752
Unit Type and Rh: 5100

## 2021-03-26 LAB — PREPARE PLATELET PHERESIS: Unit division: 0

## 2021-03-26 NOTE — Progress Notes (Signed)
Baby taken to room with mother and father.

## 2021-03-26 NOTE — Progress Notes (Signed)
Post Partum Day 1 Subjective: no complaints, up ad lib, voiding, tolerating PO, and + flatus.  No HA, vision change, RUQ pain, CP/SOB.  Objective: Blood pressure (!) 92/54, pulse 68, temperature 98.4 F (36.9 C), temperature source Oral, resp. rate 16, height 5\' 5"  (1.651 m), weight 72.6 kg, SpO2 100 %.  Physical Exam:  General: alert, cooperative, and appears stated age Lochia: appropriate Uterine Fundus: firm Incision: n/a DVT Evaluation: No evidence of DVT seen on physical exam. Negative Homan's sign. No cords or calf tenderness.  Recent Labs    03/25/21 2214 03/26/21 0408  HGB 10.9* 10.6*  HCT 30.5* 29.5*   CBC Latest Ref Rng & Units 03/26/2021 03/25/2021 03/25/2021  WBC 4.0 - 10.5 K/uL 13.0(H) 15.2(H) 12.3(H)  Hemoglobin 12.0 - 15.0 g/dL 10.6(L) 10.9(L) 11.2(L)  Hematocrit 36.0 - 46.0 % 29.5(L) 30.5(L) 31.4(L)  Platelets 150 - 400 K/uL 105(L) 97(L) 89(L)   CMP Latest Ref Rng & Units 03/26/2021 03/25/2021 03/25/2021  Glucose 70 - 99 mg/dL 97 92 03/27/2021)  BUN 6 - 20 mg/dL 833(A) 5(L) 6  Creatinine 0.44 - 1.00 mg/dL <2(N 0.53 9.76  Sodium 135 - 145 mmol/L 135 130(L) 131(L)  Potassium 3.5 - 5.1 mmol/L 3.8 3.9 3.9  Chloride 98 - 111 mmol/L 104 104 104  CO2 22 - 32 mmol/L 21(L) 19(L) 19(L)  Calcium 8.9 - 10.3 mg/dL 7.5(L) 7.9(L) 8.0(L)  Total Protein 6.5 - 8.1 g/dL 5.0(L) 5.5(L) 5.5(L)  Total Bilirubin 0.3 - 1.2 mg/dL 0.7 7.34) 1.3(H)  Alkaline Phos 38 - 126 U/L 101 111 109  AST 15 - 41 U/L 83(H) 111(H) 133(H)  ALT 0 - 44 U/L 189(H) 222(H) 249(H)    Assessment/Plan: -HELLP syndrom-Patient continues to be asymptomatic.  Labs are normalizing.  Q 6 h labs discontinued.  Will check tomorrow AM.  S/P 2u PLT intrapartum.  Magnesium recovery d/c'd early secondary to hypotensions.  -Placental path pending and Anora sent.  Plan outpatient w/u with MFM after D/C. -Plan for discharge tomorrow   LOS: 2 days   1.9(F 03/26/2021, 10:12 AM

## 2021-03-27 ENCOUNTER — Encounter (HOSPITAL_COMMUNITY): Payer: Self-pay | Admitting: Obstetrics & Gynecology

## 2021-03-27 LAB — COMPREHENSIVE METABOLIC PANEL
ALT: 145 U/L — ABNORMAL HIGH (ref 0–44)
AST: 52 U/L — ABNORMAL HIGH (ref 15–41)
Albumin: 2.4 g/dL — ABNORMAL LOW (ref 3.5–5.0)
Alkaline Phosphatase: 98 U/L (ref 38–126)
Anion gap: 6 (ref 5–15)
BUN: 5 mg/dL — ABNORMAL LOW (ref 6–20)
CO2: 23 mmol/L (ref 22–32)
Calcium: 8.3 mg/dL — ABNORMAL LOW (ref 8.9–10.3)
Chloride: 109 mmol/L (ref 98–111)
Creatinine, Ser: 0.67 mg/dL (ref 0.44–1.00)
GFR, Estimated: >60 mL/min (ref >60)
Glucose, Bld: 87 mg/dL (ref 70–99)
Potassium: 3.9 mmol/L (ref 3.5–5.1)
Sodium: 138 mmol/L (ref 135–145)
Total Bilirubin: 0.6 mg/dL (ref 0.3–1.2)
Total Protein: 5.1 g/dL — ABNORMAL LOW (ref 6.5–8.1)

## 2021-03-27 LAB — CBC
HCT: 29 % — ABNORMAL LOW (ref 36.0–46.0)
Hemoglobin: 10.3 g/dL — ABNORMAL LOW (ref 12.0–15.0)
MCH: 32.5 pg (ref 26.0–34.0)
MCHC: 35.5 g/dL (ref 30.0–36.0)
MCV: 91.5 fL (ref 80.0–100.0)
Platelets: 131 10*3/uL — ABNORMAL LOW (ref 150–400)
RBC: 3.17 MIL/uL — ABNORMAL LOW (ref 3.87–5.11)
RDW: 13 % (ref 11.5–15.5)
WBC: 10.8 10*3/uL — ABNORMAL HIGH (ref 4.0–10.5)
nRBC: 0 % (ref 0.0–0.2)

## 2021-03-27 MED ORDER — IBUPROFEN 600 MG PO TABS: 600.0000 mg | ORAL_TABLET | Freq: Four times a day (QID) | ORAL | 0 refills | Status: DC

## 2021-03-27 NOTE — Discharge Summary (Signed)
Obstetric Discharge Summary  Brenda Mason is a 25 y.o. female that presented on 03/24/2021 for abdominal pain at [redacted] weeks gestation. Pregnancy was complicated by known intrauterine growth restriction with elevated umbilical artery dopplers. LFTs were noted to be elevated and platelets were low. Labs were trended and she was begun on magnesium and she was diagnosed with presumed HELLP syndrome.  MFM was consulted.  Her labs progressed and induction was recommended for worsening HELLP syndrome and maternal life saving measures.  She received platelet transfusion in preparation for delivery. She delivered a nonviable female infant on 03/25/2021. Following delivery, lab abnormalities improved significantly. On 03/27/21 she reported well controlled pain, spontaneous voiding, ambulating without difficulty, and tolerating PO.  She was stable for discharge home on 03/27/21 with plans for in-office follow up.  Hemoglobin  Date Value Ref Range Status  03/27/2021 10.3 (L) 12.0 - 15.0 g/dL Final   HCT  Date Value Ref Range Status  03/27/2021 29.0 (L) 36.0 - 46.0 % Final    Physical Exam:  General: alert and no distress Lochia: appropriate Abdomen: nontender DVT Evaluation: No evidence of DVT seen on physical exam.  Discharge Diagnoses: Term Pregnancy-delivered  Discharge Information: Date: 03/27/2021 Activity: Pelvic rest, as tolerated Diet: routine Medications: Tylenol, motrin Condition: stable Instructions: Refer to practice specific booklet.  Discussed prior to discharge.  Discharge to: Home  Follow-up Information     [place] Follow up.   Contact information: [address] [place] [address] [phone]                 Newborn Data: Kerry Dory female  Birth Weight: 8.6 oz (244 g) APGAR: 0, 0  Newborn Delivery   Birth date/time: 03/25/2021 20:55:00 Delivery type: Vaginal, Spontaneous      [name] 03/27/2021, 10:51 PM

## 2021-03-27 NOTE — Progress Notes (Signed)
Pt, Brenda Mason, and her husband Brenda Mason sitting on the couch in pt's room, holding Nurse, mental health. Chaplain asked open ended questions to facilitate story telling and emotional expression. Brenda Mason and Brenda Mason shared the story of their pregnancy with Engineer, production and shared about his birth.  Brenda Mason named feelings of grief and guilt as well worry about future pregnancies.  Couple requested support in deciding burial plans for their son. Chaplain provided grief education as well as local resources and facilitated initial contact with the funeral home the couple chose, [employer]. Chaplain facilitated and provided support as Brenda Mason and Brenda Mason said their final good-byes and walked the couple to their car.  Please page as further needs arise.  Maryanna Shape. Carley Hammed, M.Div. [facility] Chaplain Pager [phone] Office [phone]      03/27/21 1330  Clinical Encounter Type  Visited With Patient and family together  Visit Type Death  Referral From Nurse  Spiritual Encounters  Spiritual Needs Emotional;Grief support  Stress Factors  Patient Stress Factors Loss  Family Stress Factors Loss

## 2021-03-27 NOTE — Plan of Care (Signed)
Patient to be discharged with printed instructions. Pharell Rolfson L Alyxis Grippi, RN  

## 2021-03-27 NOTE — Progress Notes (Signed)
Postpartum Progress Note  Post Partum Day 2 s/p spontaneous vaginal delivery. Pregnancy complicated by HELLP styndrome at 22 weeks, growth restriction.  Patient reports well-controlled pain, ambulating without difficulty, voiding spontaneously, tolerating PO.  Vaginal bleeding is appropriate.  She denies headache, vision changes, RUQ or epigastric pain.    Objective: Blood pressure 110/65, pulse 73, temperature 97.8 F (36.6 C), temperature source Oral, resp. rate 18, height 5\' 5"  (1.651 m), weight 72.6 kg, SpO2 99 %.  Physical Exam:  General: alert and no distress Lochia: appropriate Uterine Fundus: firm DVT Evaluation: No evidence of DVT seen on physical exam.  Recent Labs    03/26/21 0408 03/27/21 0420  HGB 10.6* 10.3*  HCT 29.5* 29.0*    Assessment/Plan: Postpartum Day 1, s/p vaginal delivery, previable. HELLP syndrome LFTs improving: AST/ALT 83/189 > 52/145 this AM. PLT: PLT 105 > 131 this AM BP normotensive since admission. S/p magnesium. Path and Anora pending.  Patient desires discharge, she is asymptomatic. Plan for discharge home with follow up this week in office for repeat labs.    LOS: 3 days   03/29/21 03/27/2021, 7:14 AM

## 2021-03-29 LAB — SURGICAL PATHOLOGY

## 2021-03-30 ENCOUNTER — Ambulatory Visit: Payer: 59

## 2021-04-06 ENCOUNTER — Telehealth (HOSPITAL_COMMUNITY): Payer: Self-pay | Admitting: *Deleted

## 2021-04-06 NOTE — Telephone Encounter (Signed)
Mom reports feeling good physically, but still feeling emotional at times with loss of her baby. EPDS=15 (no hospital screening). Discussed sharing results with Dr Langston Masker and suggested she reach out to her if any continuing anxiety or sadness. Mom agreed.Infant loss at [redacted] weeks gestation.  Duffy Rhody, RN 04-06-2021 at 11:27am

## 2021-04-25 ENCOUNTER — Other Ambulatory Visit: Payer: Self-pay

## 2021-04-26 ENCOUNTER — Other Ambulatory Visit: Payer: Self-pay

## 2021-04-26 ENCOUNTER — Ambulatory Visit: Payer: 59 | Attending: Obstetrics & Gynecology

## 2021-04-26 ENCOUNTER — Ambulatory Visit: Payer: 59

## 2021-04-26 DIAGNOSIS — O1424 HELLP syndrome, complicating childbirth: Secondary | ICD-10-CM

## 2021-04-26 NOTE — Progress Notes (Signed)
Ms. Ey was scheduled for a lab only visit today for the following tests:  Anti Cardiolipin antibodies Lupus anticoagulant Beta 2 glycoprotein 1 LCHAD carrier testing  The patient was also scheduled for an MFM consultation with Dr. Grace Bushy in 3 weeks to review these results and future pregnancy management. We will add genetic counseling as needed based upon the lab results.  Cherly Anderson, MS, CGC

## 2021-04-27 LAB — LUPUS ANTICOAGULANT PANEL
Dilute Viper Venom Time: 44.8 s (ref 0.0–47.0)
PTT Lupus Anticoagulant: 33.4 s (ref 0.0–51.9)

## 2021-04-27 LAB — CARDIOLIPIN ANTIBODIES, IGM+IGG
Anticardiolipin IgG: <9 GPL U/mL (ref 0–14)
Anticardiolipin IgM: 14 MPL U/mL — ABNORMAL HIGH (ref 0–12)

## 2021-04-28 LAB — BETA-2 GLYCOPROTEIN I AB, IGG: Beta-2 Glyco I IgG: <9 GPI IgG units (ref 0–20)

## 2021-05-18 ENCOUNTER — Ambulatory Visit: Payer: 59 | Attending: Obstetrics and Gynecology | Admitting: Obstetrics

## 2021-05-18 ENCOUNTER — Encounter: Payer: Self-pay | Admitting: *Deleted

## 2021-05-18 ENCOUNTER — Other Ambulatory Visit: Payer: Self-pay

## 2021-05-18 ENCOUNTER — Ambulatory Visit: Payer: 59 | Admitting: *Deleted

## 2021-05-18 VITALS — BP 138/90 | HR 104

## 2021-05-18 DIAGNOSIS — Z3169 Encounter for other general counseling and advice on procreation: Secondary | ICD-10-CM | POA: Insufficient documentation

## 2021-05-18 DIAGNOSIS — O1425 HELLP syndrome, complicating the puerperium: Secondary | ICD-10-CM | POA: Diagnosis not present

## 2021-05-18 DIAGNOSIS — O09299 Supervision of pregnancy with other poor reproductive or obstetric history, unspecified trimester: Secondary | ICD-10-CM

## 2021-05-18 DIAGNOSIS — Z8759 Personal history of other complications of pregnancy, childbirth and the puerperium: Secondary | ICD-10-CM | POA: Diagnosis not present

## 2021-05-18 DIAGNOSIS — Z634 Disappearance and death of family member: Secondary | ICD-10-CM

## 2021-05-23 LAB — GENE SPECIFIC SEQUENCING

## 2021-05-25 NOTE — Progress Notes (Signed)
MFM Note  Brenda Mason is a 25 year old gravida 1 para 0 who was seen for a prenatal consultation at the request of Dr. Mitchel Honour as her last pregnancy in August 2022 was complicated by HELLP syndrome requiring an indicated therapeutic previable delivery at 22+ weeks.  That pregnancy was complicated by early onset severe fetal growth restriction with abnormal umbilical artery Doppler studies (absent end-diastolic flow).    She presented to the hospital at 22+ weeks complaining of epigastric and back pain for the 5 days.  On admission she was noted to have elevated liver function tests (AST and ALT) in the 200s range, low platelets 88,000, and hemolysis as indicated by her increased LDH levels.  These labs got progressively worse throughout the day requiring delivery as a maternal life-saving procedure.  Her blood pressures were within normal limits and she did not have significant proteinuria.  Her complement levels were within normal limits.  She underwent an induction of labor and delivered a nonviable fetus.  As her platelet counts dropped to the 50-60,000 range, she required 2 platelet transfusions to decrease her risk of hemorrhage at the time of delivery.  Her labs all improved after delivery.  The placental pathology report showed a small, 50 g second trimester placenta with a three-vessel umbilical cord.  There were scattered calcifications noted within the placenta.  The [facility] miscarriage test indicated a normal female fetus.  The patient received magnesium for maternal seizure prophylaxis following delivery and was discharged home 2 days after delivery as her labs were improving and she was clinically stable.  She had a negative antiphospholipid antibody (IgM and IgG anticardiolipin antibodies, lupus anti-coagulant and IgM and IgG beta-2 glycoprotein 1) test one month after her delivery, indicating that the antiphospholipid antibody syndrome was an unlikely cause of severe  preeclampsia/HELLP syndrome.  She denies any significant past medical history.  Her prior surgical history includes a tonsillectomy and removal of adenoids.  Brenda Mason reports that she has been doing well since delivery and has completely recovered from her delivery.  She was seen in consultation today to review the events that her occurred during her hospitalization/delivery and to discuss the management that may improve her future pregnancy outcome.  During our consultation today, the events leading up to her delivery and her lab work that led to the diagnosis of HELLP syndrome were reviewed in detail.  She was advised that abnormal placentation and severe placental dysfunction most likely contributed to fetal growth restriction with abnormal umbilical artery Doppler studies and early onset HELLP syndrome.  She was reassured that there was nothing that she did to have caused the abnormal placental function and there was probably nothing that could have been done to have prevented the outcome. Fortunately, each new pregnancy comes with a new placenta and hopefully with normal placental implantation and normal placental function, she can anticipate an improved pregnancy outcome in the future.  The reported rate of recurrence of preeclampsia in a subsequent pregnancy is between 2% to 19%.  She was reassured that most women with her history will most likely have a normal pregnancy in their subsequent pregnancies.  To help improve her pregnancy outcome in the future, she should start taking a daily baby aspirin (81 mg) and a prenatal vitamin with folic acid now.  As she inquired if taking magnesium supplements may improve her outcome, she was advised that it is unknown if magnesium supplements will prevent preeclampsia, however it probably will not do any harm.  Therefore, she may take additional magnesium supplements if she desires.    Once she conceives her future pregnancy, she should have a cell free  DNA test drawn to screen for fetal aneuploidy at between 10 to 12 weeks.  She should be sent to our office for a first trimester ultrasound for early assessment of the fetal anatomy.  She may consider taking 2 tablets of baby aspirin starting at 12 weeks, as there is some evidence that a larger dose of aspirin may have a better effect in preeclampsia prevention.  We will then see her for a detailed fetal anatomy scan at 19 weeks.  She should then be followed with monthly growth ultrasounds.    As the patient reports that she began to experience abdominal pain starting at around 20 weeks that ultimately led to her diagnosis of HELLP syndrome, it may be reasonable to follow her with monthly PIH labs starting at between 20 to 22 weeks.    Should she not develop preeclampsia in her future pregnancy, she can await spontaneous labor for delivery at term.  However should she develop preeclampsia, we will make further recommendations at that time.    Our genetic counselor will follow up with the [employer] preeclampsia test and will notify the patient once the results are available.    At the end of the consultation, the patient stated that all her questions had been answered to her complete satisfaction.  A total of 60 minutes was spent counseling and coordinating the care for this patient.  Greater than 50% of the time was spent in direct face-to-face contact.

## 2021-07-30 NOTE — L&D Delivery Note (Signed)
Delivery Note  SVD viable female Apgars 9,9 over 1st degree ML laceration.  Placenta delivered spontaneously intact with 3VC. Repair with 2-0 Chromic with good support and hemostasis noted.  R/V exam confirms. Given TXA empirically.   Mother and baby to couplet care and are doing well.  EBL 302cc  Candice Camp, MD

## 2021-08-21 LAB — OB RESULTS CONSOLE RPR: RPR: NONREACTIVE

## 2021-08-21 LAB — OB RESULTS CONSOLE RUBELLA ANTIBODY, IGM: Rubella: IMMUNE

## 2021-08-21 LAB — OB RESULTS CONSOLE HEPATITIS B SURFACE ANTIGEN: Hepatitis B Surface Ag: NEGATIVE

## 2021-08-21 LAB — OB RESULTS CONSOLE ABO/RH: RH Type: POSITIVE

## 2021-08-21 LAB — HEPATITIS C ANTIBODY: HCV Ab: NEGATIVE

## 2021-08-21 LAB — OB RESULTS CONSOLE ANTIBODY SCREEN: Antibody Screen: NEGATIVE

## 2021-08-21 LAB — OB RESULTS CONSOLE HIV ANTIBODY (ROUTINE TESTING): HIV: NONREACTIVE

## 2021-08-22 ENCOUNTER — Other Ambulatory Visit: Payer: Self-pay | Admitting: Obstetrics & Gynecology

## 2021-08-22 DIAGNOSIS — Z363 Encounter for antenatal screening for malformations: Secondary | ICD-10-CM

## 2021-09-04 LAB — OB RESULTS CONSOLE GC/CHLAMYDIA
Chlamydia: NEGATIVE
Neisseria Gonorrhea: NEGATIVE

## 2021-10-02 ENCOUNTER — Ambulatory Visit: Payer: 59 | Attending: Obstetrics & Gynecology

## 2021-10-02 ENCOUNTER — Other Ambulatory Visit: Payer: Self-pay

## 2021-10-02 ENCOUNTER — Ambulatory Visit (HOSPITAL_BASED_OUTPATIENT_CLINIC_OR_DEPARTMENT_OTHER): Payer: 59 | Admitting: Obstetrics

## 2021-10-02 ENCOUNTER — Other Ambulatory Visit: Payer: Self-pay | Admitting: *Deleted

## 2021-10-02 ENCOUNTER — Ambulatory Visit: Payer: 59 | Admitting: *Deleted

## 2021-10-02 ENCOUNTER — Encounter: Payer: Self-pay | Admitting: *Deleted

## 2021-10-02 VITALS — BP 132/73 | HR 96

## 2021-10-02 DIAGNOSIS — Z8759 Personal history of other complications of pregnancy, childbirth and the puerperium: Secondary | ICD-10-CM

## 2021-10-02 DIAGNOSIS — O09299 Supervision of pregnancy with other poor reproductive or obstetric history, unspecified trimester: Secondary | ICD-10-CM

## 2021-10-02 DIAGNOSIS — Z363 Encounter for antenatal screening for malformations: Secondary | ICD-10-CM | POA: Insufficient documentation

## 2021-10-02 DIAGNOSIS — Z3A15 15 weeks gestation of pregnancy: Secondary | ICD-10-CM | POA: Insufficient documentation

## 2021-10-02 DIAGNOSIS — O09292 Supervision of pregnancy with other poor reproductive or obstetric history, second trimester: Secondary | ICD-10-CM | POA: Diagnosis not present

## 2021-10-03 NOTE — Progress Notes (Signed)
MFM Note ? ?Brenda Mason was seen for a detailed fetal anatomy scan as her prior pregnancy was complicated by early onset severe preeclampsia/HELLP syndrome requiring a previable delivery at 22+ weeks.  That pregnancy was complicated by early onset severe fetal growth restriction with abnormal umbilical artery Doppler studies (absent end-diastolic flow). ? ?The placental pathology report from that pregnancy showed a small 50 g second trimester placenta with three-vessel umbilical cord.  There were scattered calcifications noted within the placenta.  The [employer] miscarriage test indicated a normal female fetus. ? ?She had a negative antiphospholipid antibody (IgM and IgG anticardiolipin antibodies, lupus anti-coagulant and IgM and IgG beta-2 glycoprotein 1) test one month after her delivery, indicating that the antiphospholipid antibody syndrome was an unlikely cause of severe preeclampsia/HELLP syndrome. ? ?She is taking 2 daily baby aspirins for preeclampsia prophylaxis. ? ?She denies any problems in her current pregnancy.   ? ?She had a cell free DNA test earlier in her pregnancy which indicated a low risk for trisomy 77, 8, and 13. A female fetus is predicted.  ? ?The overall fetal growth noted today is within 2 days of her due date of March 18, 2022. There was normal amniotic fluid noted. ? ?There were no obvious fetal anomalies noted on today's ultrasound exam. However, today's exam was limited due to her early gestational age. ? ?The patient was informed that anomalies may be missed due to technical limitations. If the fetus is in a suboptimal position or maternal habitus is increased, visualization of the fetus in the maternal uterus may be impaired. ? ?Due to her prior poor pregnancy history, she was advised to continue taking 2 daily baby aspirins a day for prevention of preeclampsia.   ? ?We will continue to follow her with monthly growth ultrasounds.   ? ?She should have monthly PIH labs drawn  starting at 19 weeks.   ? ?As the poor outcome in her prior pregnancy was most likely the result of placental dysfunction, hopefully, with a new placenta in this pregnancy, we will have a better outcome.   ? ?We will continue to follow her closely with you throughout her pregnancy.   ? ?The patient stated that all of her questions have been answered. ? ?She will return in 3 weeks for a follow-up ultrasound to complete the views of the fetal anatomy and to assess fetal growth. ? ?A total of 20 minutes was spent counseling and coordinating the care for this patient.  Greater than 50% of the time was spent in direct face-to-face contact. ?

## 2021-10-23 ENCOUNTER — Other Ambulatory Visit: Payer: Self-pay

## 2021-10-23 ENCOUNTER — Encounter: Payer: Self-pay | Admitting: *Deleted

## 2021-10-23 ENCOUNTER — Ambulatory Visit: Payer: 59 | Attending: Obstetrics

## 2021-10-23 ENCOUNTER — Other Ambulatory Visit: Payer: Self-pay | Admitting: *Deleted

## 2021-10-23 ENCOUNTER — Ambulatory Visit: Payer: 59 | Admitting: *Deleted

## 2021-10-23 VITALS — BP 121/66 | HR 76

## 2021-10-23 DIAGNOSIS — Z8759 Personal history of other complications of pregnancy, childbirth and the puerperium: Secondary | ICD-10-CM

## 2021-10-23 DIAGNOSIS — Z362 Encounter for other antenatal screening follow-up: Secondary | ICD-10-CM | POA: Diagnosis not present

## 2021-10-23 DIAGNOSIS — Z3A19 19 weeks gestation of pregnancy: Secondary | ICD-10-CM | POA: Diagnosis not present

## 2021-10-23 DIAGNOSIS — O09292 Supervision of pregnancy with other poor reproductive or obstetric history, second trimester: Secondary | ICD-10-CM | POA: Diagnosis not present

## 2021-10-23 DIAGNOSIS — O09299 Supervision of pregnancy with other poor reproductive or obstetric history, unspecified trimester: Secondary | ICD-10-CM

## 2021-11-22 ENCOUNTER — Ambulatory Visit (HOSPITAL_BASED_OUTPATIENT_CLINIC_OR_DEPARTMENT_OTHER): Payer: 59

## 2021-11-22 ENCOUNTER — Ambulatory Visit: Payer: 59 | Attending: Obstetrics and Gynecology | Admitting: *Deleted

## 2021-11-22 ENCOUNTER — Other Ambulatory Visit: Payer: Self-pay | Admitting: *Deleted

## 2021-11-22 VITALS — BP 127/76 | HR 101

## 2021-11-22 DIAGNOSIS — Z3A23 23 weeks gestation of pregnancy: Secondary | ICD-10-CM | POA: Diagnosis not present

## 2021-11-22 DIAGNOSIS — O1492 Unspecified pre-eclampsia, second trimester: Secondary | ICD-10-CM | POA: Diagnosis not present

## 2021-11-22 DIAGNOSIS — O321XX0 Maternal care for breech presentation, not applicable or unspecified: Secondary | ICD-10-CM | POA: Diagnosis not present

## 2021-11-22 DIAGNOSIS — O09292 Supervision of pregnancy with other poor reproductive or obstetric history, second trimester: Secondary | ICD-10-CM

## 2021-11-22 DIAGNOSIS — O09299 Supervision of pregnancy with other poor reproductive or obstetric history, unspecified trimester: Secondary | ICD-10-CM

## 2021-11-22 DIAGNOSIS — Z8759 Personal history of other complications of pregnancy, childbirth and the puerperium: Secondary | ICD-10-CM

## 2021-12-07 ENCOUNTER — Ambulatory Visit: Payer: 59

## 2021-12-11 ENCOUNTER — Encounter (HOSPITAL_COMMUNITY): Payer: Self-pay | Admitting: Obstetrics and Gynecology

## 2021-12-11 ENCOUNTER — Inpatient Hospital Stay (HOSPITAL_BASED_OUTPATIENT_CLINIC_OR_DEPARTMENT_OTHER): Payer: 59

## 2021-12-11 ENCOUNTER — Inpatient Hospital Stay (HOSPITAL_COMMUNITY)
Admission: AD | Admit: 2021-12-11 | Discharge: 2021-12-11 | Disposition: A | Discharge status: Home / Self Care | Payer: 59 | Attending: Obstetrics and Gynecology | Admitting: Obstetrics and Gynecology

## 2021-12-11 DIAGNOSIS — O9A212 Injury, poisoning and certain other consequences of external causes complicating pregnancy, second trimester: Secondary | ICD-10-CM

## 2021-12-11 DIAGNOSIS — Z3A26 26 weeks gestation of pregnancy: Secondary | ICD-10-CM | POA: Diagnosis not present

## 2021-12-11 DIAGNOSIS — O09292 Supervision of pregnancy with other poor reproductive or obstetric history, second trimester: Secondary | ICD-10-CM

## 2021-12-11 DIAGNOSIS — T1490XA Injury, unspecified, initial encounter: Secondary | ICD-10-CM

## 2021-12-11 DIAGNOSIS — Z041 Encounter for examination and observation following transport accident: Secondary | ICD-10-CM | POA: Diagnosis not present

## 2021-12-11 LAB — URINALYSIS, ROUTINE W REFLEX MICROSCOPIC
Bilirubin Urine: NEGATIVE
Glucose, UA: NEGATIVE mg/dL
Hgb urine dipstick: NEGATIVE
Ketones, ur: NEGATIVE mg/dL
Leukocytes,Ua: NEGATIVE
Nitrite: NEGATIVE
Protein, ur: NEGATIVE mg/dL
Specific Gravity, Urine: 1.003 — ABNORMAL LOW (ref 1.005–1.030)
pH: 7 (ref 5.0–8.0)
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
## 2021-12-11 NOTE — Discharge Instructions (Addendum)
All of the tests performed today found that your baby/pregnancy is healthy. If you experience more than 6 painful contractions in 1 hour, vaginal bleeding as described below, leaking or gushing fluid from your vagina, or decreased/no fetal movement. ? ?Return to MAU: ?If you have heavier bleeding that soaks through more that 2 pads per hour for an hour or more ?If you bleed so much that you feel like you might pass out or you do pass out ?If you have significant abdominal pain that is not improved with Tylenol 1000 mg every 8 hours as needed for pain ?If you develop a fever > 100.5 ? ?

## 2021-12-27 ENCOUNTER — Ambulatory Visit: Payer: 59 | Admitting: *Deleted

## 2021-12-27 ENCOUNTER — Ambulatory Visit: Payer: 59 | Attending: Maternal & Fetal Medicine

## 2021-12-27 DIAGNOSIS — O09292 Supervision of pregnancy with other poor reproductive or obstetric history, second trimester: Secondary | ICD-10-CM

## 2021-12-27 DIAGNOSIS — Z3A28 28 weeks gestation of pregnancy: Secondary | ICD-10-CM | POA: Diagnosis not present

## 2021-12-27 DIAGNOSIS — O09293 Supervision of pregnancy with other poor reproductive or obstetric history, third trimester: Secondary | ICD-10-CM

## 2021-12-27 DIAGNOSIS — Z8759 Personal history of other complications of pregnancy, childbirth and the puerperium: Secondary | ICD-10-CM | POA: Insufficient documentation

## 2021-12-28 ENCOUNTER — Other Ambulatory Visit: Payer: Self-pay | Admitting: *Deleted

## 2021-12-28 DIAGNOSIS — O09299 Supervision of pregnancy with other poor reproductive or obstetric history, unspecified trimester: Secondary | ICD-10-CM

## 2021-12-28 DIAGNOSIS — Z8759 Personal history of other complications of pregnancy, childbirth and the puerperium: Secondary | ICD-10-CM

## 2021-12-28 DIAGNOSIS — O09293 Supervision of pregnancy with other poor reproductive or obstetric history, third trimester: Secondary | ICD-10-CM

## 2022-01-24 ENCOUNTER — Encounter: Payer: Self-pay | Admitting: *Deleted

## 2022-01-24 ENCOUNTER — Ambulatory Visit (HOSPITAL_BASED_OUTPATIENT_CLINIC_OR_DEPARTMENT_OTHER): Payer: 59 | Admitting: Maternal & Fetal Medicine

## 2022-01-24 ENCOUNTER — Ambulatory Visit (HOSPITAL_BASED_OUTPATIENT_CLINIC_OR_DEPARTMENT_OTHER): Payer: 59

## 2022-01-24 ENCOUNTER — Other Ambulatory Visit: Payer: Self-pay | Admitting: *Deleted

## 2022-01-24 ENCOUNTER — Encounter (HOSPITAL_COMMUNITY): Payer: Self-pay | Admitting: Obstetrics and Gynecology

## 2022-01-24 ENCOUNTER — Inpatient Hospital Stay (HOSPITAL_COMMUNITY)
Admission: AD | Admit: 2022-01-24 | Discharge: 2022-01-24 | Disposition: A | Discharge status: Home / Self Care | Payer: 59 | Attending: Obstetrics and Gynecology | Admitting: Obstetrics and Gynecology

## 2022-01-24 ENCOUNTER — Other Ambulatory Visit: Payer: Self-pay

## 2022-01-24 ENCOUNTER — Ambulatory Visit: Payer: 59 | Admitting: *Deleted

## 2022-01-24 VITALS — BP 153/75 | HR 100

## 2022-01-24 DIAGNOSIS — Z8759 Personal history of other complications of pregnancy, childbirth and the puerperium: Secondary | ICD-10-CM

## 2022-01-24 DIAGNOSIS — O133 Gestational [pregnancy-induced] hypertension without significant proteinuria, third trimester: Secondary | ICD-10-CM | POA: Insufficient documentation

## 2022-01-24 DIAGNOSIS — O09299 Supervision of pregnancy with other poor reproductive or obstetric history, unspecified trimester: Secondary | ICD-10-CM | POA: Insufficient documentation

## 2022-01-24 DIAGNOSIS — O09293 Supervision of pregnancy with other poor reproductive or obstetric history, third trimester: Secondary | ICD-10-CM

## 2022-01-24 DIAGNOSIS — Z3A32 32 weeks gestation of pregnancy: Secondary | ICD-10-CM | POA: Insufficient documentation

## 2022-01-24 DIAGNOSIS — Z3689 Encounter for other specified antenatal screening: Secondary | ICD-10-CM | POA: Insufficient documentation

## 2022-01-24 HISTORY — DX: HELLP syndrome (HELLP), unspecified trimester: O14.20

## 2022-01-24 LAB — COMPREHENSIVE METABOLIC PANEL
ALT: 15 U/L (ref 0–44)
AST: 21 U/L (ref 15–41)
Albumin: 2.8 g/dL — ABNORMAL LOW (ref 3.5–5.0)
Alkaline Phosphatase: 146 U/L — ABNORMAL HIGH (ref 38–126)
Anion gap: 6 (ref 5–15)
BUN: 5 mg/dL — ABNORMAL LOW (ref 6–20)
CO2: 23 mmol/L (ref 22–32)
Calcium: 9.3 mg/dL (ref 8.9–10.3)
Chloride: 111 mmol/L (ref 98–111)
Creatinine, Ser: 0.57 mg/dL (ref 0.44–1.00)
GFR, Estimated: >60 mL/min (ref >60)
Glucose, Bld: 70 mg/dL (ref 70–99)
Potassium: 4.1 mmol/L (ref 3.5–5.1)
Sodium: 140 mmol/L (ref 135–145)
Total Bilirubin: 1.1 mg/dL (ref 0.3–1.2)
Total Protein: 6 g/dL — ABNORMAL LOW (ref 6.5–8.1)

## 2022-01-24 LAB — URINALYSIS, ROUTINE W REFLEX MICROSCOPIC
Bilirubin Urine: NEGATIVE
Glucose, UA: NEGATIVE mg/dL
Hgb urine dipstick: NEGATIVE
Ketones, ur: NEGATIVE mg/dL
Leukocytes,Ua: NEGATIVE
Nitrite: NEGATIVE
Protein, ur: NEGATIVE mg/dL
Specific Gravity, Urine: 1.003 — ABNORMAL LOW (ref 1.005–1.030)
pH: 8 (ref 5.0–8.0)

## 2022-01-24 LAB — PROTEIN / CREATININE RATIO, URINE
Creatinine, Urine: 15.63 mg/dL
Total Protein, Urine: <6 mg/dL

## 2022-01-24 LAB — CBC
HCT: 32.5 % — ABNORMAL LOW (ref 36.0–46.0)
Hemoglobin: 10.9 g/dL — ABNORMAL LOW (ref 12.0–15.0)
MCH: 29.5 pg (ref 26.0–34.0)
MCHC: 33.5 g/dL (ref 30.0–36.0)
MCV: 87.8 fL (ref 80.0–100.0)
Platelets: 213 10*3/uL (ref 150–400)
RBC: 3.7 MIL/uL — ABNORMAL LOW (ref 3.87–5.11)
RDW: 12.1 % (ref 11.5–15.5)
WBC: 9.5 10*3/uL (ref 4.0–10.5)
nRBC: 0 % (ref 0.0–0.2)

## 2022-01-24 NOTE — Progress Notes (Signed)
MFM Brief Note  Ms. Pillard is a G2P0 who is here for follow up growth at the request of Mitchel Honour, DO.  Ms. Alaimo had  prior pregnancy with early previable HELLP syndrome.  Follow up growth due to gestational hypertension (asymptomatic).  Normal interval growth with measurements consistent with dates Good fetal movement and amniotic fluid volume  BPP 8/8  Today Ms. Mierzwa's blood pressure was 148/83 with a repeat of 153/75 mmHg.  She denies s/sx of preeclampsia.   Given this I have asked her to obtain [facility] labs at the MAU. I discussed the plan of care with Dr. Henderson Cloud.   Given that she is asx she may be discharged and not have to wait for her labs to return.   She is scheduled to return in 4 weeks for growth and should initiate weekly testing at her providers offices.   Delivery should be considered at 37 weeks given her new diagnosis of GHTN.   All questions answered.   I spent 30 minutes with > 50% in face to face consultation.  Novella Olive, MD

## 2022-01-24 NOTE — MAU Note (Signed)
Brenda Mason is a 26 y.o. at [redacted]w[redacted]d here in MAU reporting: hx of HELLP in previous pregnancy, was at MFM this morning for an appointment and had elevated BP. Sent to MAU for further evaluation. No headache, visual changes, or RUQ pain. No labor s/s. + FM  Onset of complaint: today  Pain score: 0/10  Vitals:   01/24/22 1055  BP: 139/84  Pulse: 97  Resp: 16  Temp: 98.6 F (37 C)  SpO2: 99%     FHT:154  Lab orders placed from triage: UA

## 2022-01-24 NOTE — MAU Provider Note (Signed)
History     CSN: [id number]  Arrival date and time: 01/24/22 1035   Event Date/Time   First Provider Initiated Contact with Patient 01/24/22 1118      Chief Complaint  Patient presents with   Hypertension   Brenda Mason is a 26 y.o. G2P0100 at [redacted]w[redacted]d who presents today for pre-eclampsia labs. She was seen in MFM today and BP was elevated. She denies any HA, visual disturbance or RUQ pain. She reports normal fetal movement. She reports that so far everything has been normal with this pregnancy. Of note her prior pregnancy resulted in a 22 week IOL 2/2 atypical HELLP syndrome, and that fetus had severe IUGR. Patient never had elevated BP, but came for eval for back pain/epigastric pain. HELLP syndrome was the cause of the pain.   Hypertension    OB History     Gravida  2   Para  1   Term  0   Preterm  1   AB  0   Living         SAB  0   IAB  0   Ectopic  0   Multiple  0   Live Births              Past Medical History:  Diagnosis Date   Chicken pox    Concussion    HELLP (hemolytic anemia/elev liver enzymes/low platelets in pregnancy)    Medical history non-contributory     Past Surgical History:  Procedure Laterality Date   ADENOIDECTOMY  2005   TONSILLECTOMY     TONSILLECTOMY  2005    Family History  Problem Relation Age of Onset   Cancer Maternal Grandmother     Social History   Tobacco Use   Smoking status: Never   Smokeless tobacco: Never  Vaping Use   Vaping Use: Never used  Substance Use Topics   Alcohol use: Not Currently   Drug use: No    Allergies: No Known Allergies  Medications Prior to Admission  Medication Sig Dispense Refill Last Dose   aspirin EC 81 MG tablet Take 162 mg by mouth daily. Swallow whole.      Prenatal Vit-Fe Fumarate-FA (PRENATAL MULTIVITAMIN) TABS tablet Take 1 tablet by mouth daily at 12 noon.       Review of Systems  All other systems reviewed and are negative.  Physical Exam   Blood  pressure 130/76, pulse 97, temperature 98.6 F (37 C), temperature source Oral, resp. rate 16, height 5\' 5"  (1.651 m), weight 90.5 kg, last menstrual period 06/16/2021, SpO2 99 %, unknown if currently breastfeeding.  Physical Exam Constitutional:      Appearance: She is well-developed.  HENT:     Head: Normocephalic.  Eyes:     Pupils: Pupils are equal, round, and reactive to light.  Cardiovascular:     Rate and Rhythm: Normal rate and regular rhythm.     Heart sounds: Normal heart sounds.  Pulmonary:     Effort: Pulmonary effort is normal. No respiratory distress.     Breath sounds: Normal breath sounds.  Abdominal:     Palpations: Abdomen is soft.     Tenderness: There is no abdominal tenderness.  Genitourinary:    Vagina: No bleeding. Vaginal discharge: mucusy.    Comments: External: no lesion Vagina: small amount of white discharge     Musculoskeletal:        General: Normal range of motion.     Cervical back: Normal range  of motion and neck supple.  Skin:    General: Skin is warm and dry.  Neurological:     Mental Status: She is alert and oriented to person, place, and time.  Psychiatric:        Mood and Affect: Mood normal.        Behavior: Behavior normal.     NST:  Baseline: 155 Variability: MODERATE Accels: 15X15 Decels: None Toco: rare Reactive/Appropriate for GA  Patient Vitals for the past 24 hrs:  BP Temp Temp src Pulse Resp SpO2 Height Weight  01/24/22 1205 -- -- -- -- -- 99 % -- --  01/24/22 1200 130/76 -- -- 97 -- 98 % -- --  01/24/22 1155 -- -- -- -- -- 98 % -- --  01/24/22 1150 -- -- -- -- -- 98 % -- --  01/24/22 1145 124/76 -- -- 100 -- 97 % -- --  01/24/22 1140 -- -- -- -- -- 98 % -- --  01/24/22 1135 -- -- -- -- -- 97 % -- --  01/24/22 1130 127/71 -- -- 95 -- 97 % -- --  01/24/22 1125 -- -- -- -- -- 97 % -- --  01/24/22 1120 -- -- -- -- -- 97 % -- --  01/24/22 1115 125/88 -- -- 96 -- 98 % -- --  01/24/22 1113 133/77 -- -- 96 -- -- -- --   01/24/22 1055 139/84 98.6 F (37 C) Oral 97 16 99 % -- --  01/24/22 1049 -- -- -- -- -- -- 5\' 5"  (1.651 m) 90.5 kg      Results for orders placed or performed during the hospital encounter of 01/24/22 (from the past 24 hour(s))  CBC     Status: Abnormal   Collection Time: 01/24/22 10:59 AM  Result Value Ref Range   WBC 9.5 4.0 - 10.5 K/uL   RBC 3.70 (L) 3.87 - 5.11 MIL/uL   Hemoglobin 10.9 (L) 12.0 - 15.0 g/dL   HCT 01/26/22 (L) 37.4 - 82.7 %   MCV 87.8 80.0 - 100.0 fL   MCH 29.5 26.0 - 34.0 pg   MCHC 33.5 30.0 - 36.0 g/dL   RDW 07.8 67.5 - 44.9 %   Platelets 213 150 - 400 K/uL   nRBC 0.0 0.0 - 0.2 %  Comprehensive metabolic panel     Status: Abnormal   Collection Time: 01/24/22 10:59 AM  Result Value Ref Range   Sodium 140 135 - 145 mmol/L   Potassium 4.1 3.5 - 5.1 mmol/L   Chloride 111 98 - 111 mmol/L   CO2 23 22 - 32 mmol/L   Glucose, Bld 70 70 - 99 mg/dL   BUN 5 (L) 6 - 20 mg/dL   Creatinine, Ser 01/26/22 0.44 - 1.00 mg/dL   Calcium 9.3 8.9 - 0.07 mg/dL   Total Protein 6.0 (L) 6.5 - 8.1 g/dL   Albumin 2.8 (L) 3.5 - 5.0 g/dL   AST 21 15 - 41 U/L   ALT 15 0 - 44 U/L   Alkaline Phosphatase 146 (H) 38 - 126 U/L   Total Bilirubin 1.1 0.3 - 1.2 mg/dL   GFR, Estimated 12.1 >97 mL/min   Anion gap 6 5 - 15  Urinalysis, Routine w reflex microscopic Urine, Clean Catch     Status: Abnormal   Collection Time: 01/24/22 11:20 AM  Result Value Ref Range   Color, Urine STRAW (A) YELLOW   APPearance CLEAR CLEAR   Specific Gravity, Urine 1.003 (L) 1.005 -  1.030   pH 8.0 5.0 - 8.0   Glucose, UA NEGATIVE NEGATIVE mg/dL   Hgb urine dipstick NEGATIVE NEGATIVE   Bilirubin Urine NEGATIVE NEGATIVE   Ketones, ur NEGATIVE NEGATIVE mg/dL   Protein, ur NEGATIVE NEGATIVE mg/dL   Nitrite NEGATIVE NEGATIVE   Leukocytes,Ua NEGATIVE NEGATIVE  Protein / creatinine ratio, urine     Status: None   Collection Time: 01/24/22 11:20 AM  Result Value Ref Range   Creatinine, Urine 15.63 mg/dL   Total  Protein, Urine <6.0 mg/dL   Protein Creatinine Ratio        0.00 - 0.15 mg/mg[Cre]     MAU Course  Procedures  MDM All pre-eclampsia and HELLP labs are today. No proteinuria. Patient advised to keep FU appts with OB office and MFM to continue to monitor.   Assessment and Plan   1. Gestational hypertension, third trimester   2. Hx of HELLP syndrome, currently pregnant   3. Previous baby with fetal growth restriction   4. Prior pregnancy with fetal demise and current pregnancy in third trimester   5. [redacted] weeks gestation of pregnancy   6. NST (non-stress test) reactive    DC home in stable condition  Pre-eclampsia warning signs  3rd Trimester precautions  PTL precautions  Fetal kick counts RX: none  Return to MAU as needed FU with OB as planned   Follow-up Information     Harold Hedge, MD Follow up.   Specialty: Obstetrics and Gynecology Why: As scheduled Contact information: [address] [place] [address] [phone]         [facility] Follow up.   Specialty: Maternal and Fetal Medicine Why: As scheduled Contact information: [address], [address] [place] [address] [phone]               Thressa Sheller DNP, CNM  01/24/22  12:21 PM

## 2022-01-31 ENCOUNTER — Ambulatory Visit: Payer: 59

## 2022-02-07 ENCOUNTER — Ambulatory Visit: Payer: 59

## 2022-02-14 ENCOUNTER — Ambulatory Visit: Payer: 59

## 2022-02-14 ENCOUNTER — Inpatient Hospital Stay (HOSPITAL_COMMUNITY)
Admission: AD | Admit: 2022-02-14 | Discharge: 2022-02-14 | Disposition: A | Discharge status: Home / Self Care | Payer: 59 | Attending: Obstetrics and Gynecology | Admitting: Obstetrics and Gynecology

## 2022-02-14 ENCOUNTER — Encounter (HOSPITAL_COMMUNITY): Payer: Self-pay | Admitting: Obstetrics and Gynecology

## 2022-02-14 ENCOUNTER — Ambulatory Visit: Payer: 59 | Admitting: *Deleted

## 2022-02-14 VITALS — BP 161/92 | HR 105

## 2022-02-14 DIAGNOSIS — R030 Elevated blood-pressure reading, without diagnosis of hypertension: Secondary | ICD-10-CM

## 2022-02-14 DIAGNOSIS — Z8759 Personal history of other complications of pregnancy, childbirth and the puerperium: Secondary | ICD-10-CM | POA: Insufficient documentation

## 2022-02-14 DIAGNOSIS — O133 Gestational [pregnancy-induced] hypertension without significant proteinuria, third trimester: Secondary | ICD-10-CM | POA: Insufficient documentation

## 2022-02-14 DIAGNOSIS — O10913 Unspecified pre-existing hypertension complicating pregnancy, third trimester: Secondary | ICD-10-CM | POA: Diagnosis not present

## 2022-02-14 DIAGNOSIS — O26893 Other specified pregnancy related conditions, third trimester: Secondary | ICD-10-CM | POA: Diagnosis not present

## 2022-02-14 DIAGNOSIS — Z3A35 35 weeks gestation of pregnancy: Secondary | ICD-10-CM | POA: Diagnosis not present

## 2022-02-14 DIAGNOSIS — Z3A00 Weeks of gestation of pregnancy not specified: Secondary | ICD-10-CM | POA: Insufficient documentation

## 2022-02-14 LAB — COMPREHENSIVE METABOLIC PANEL
ALT: 14 U/L (ref 0–44)
AST: 22 U/L (ref 15–41)
Albumin: 2.4 g/dL — ABNORMAL LOW (ref 3.5–5.0)
Alkaline Phosphatase: 176 U/L — ABNORMAL HIGH (ref 38–126)
Anion gap: 6 (ref 5–15)
BUN: 5 mg/dL — ABNORMAL LOW (ref 6–20)
CO2: 22 mmol/L (ref 22–32)
Calcium: 8.3 mg/dL — ABNORMAL LOW (ref 8.9–10.3)
Chloride: 109 mmol/L (ref 98–111)
Creatinine, Ser: 0.67 mg/dL (ref 0.44–1.00)
GFR, Estimated: >60 mL/min (ref >60)
Glucose, Bld: 89 mg/dL (ref 70–99)
Potassium: 3.7 mmol/L (ref 3.5–5.1)
Sodium: 137 mmol/L (ref 135–145)
Total Bilirubin: 0.8 mg/dL (ref 0.3–1.2)
Total Protein: 5.3 g/dL — ABNORMAL LOW (ref 6.5–8.1)

## 2022-02-14 LAB — CBC
HCT: 29.4 % — ABNORMAL LOW (ref 36.0–46.0)
Hemoglobin: 9.8 g/dL — ABNORMAL LOW (ref 12.0–15.0)
MCH: 28.6 pg (ref 26.0–34.0)
MCHC: 33.3 g/dL (ref 30.0–36.0)
MCV: 85.7 fL (ref 80.0–100.0)
Platelets: 167 10*3/uL (ref 150–400)
RBC: 3.43 MIL/uL — ABNORMAL LOW (ref 3.87–5.11)
RDW: 12.5 % (ref 11.5–15.5)
WBC: 8.9 10*3/uL (ref 4.0–10.5)
nRBC: 0 % (ref 0.0–0.2)

## 2022-02-14 LAB — PROTEIN / CREATININE RATIO, URINE
Creatinine, Urine: 31 mg/dL
Total Protein, Urine: <6 mg/dL

## 2022-02-14 NOTE — MAU Provider Note (Signed)
History     CSN: [id number]  Arrival date and time: 02/14/22 1146   None     Chief Complaint  Patient presents with   Hypertension   HPI Brenda Mason is a 26 y.o. G2P0100 at [redacted]w[redacted]d who presents from office for pre-eclampsia workup. She was seen in MFM today where BP was found to be 160s/90s. She then went to her primary OB office where BP's were found to be 140s/70s-80s. She denies headache, vision changes, or RUQ/epigastric pain. No significant swelling, contractions, vaginal bleeding or leaking fluid. She endorses active fetal movement.  She receives prenatal care at [facility] and has an appointment scheduled tomorrow.   Of note, her prior pregnancy was complicated by an IOL at 22 weeks in the setting of atypical HELLP syndrome/early onset severe pre-eclampsia.  OB History     Gravida  2   Para  1   Term  0   Preterm  1   AB  0   Living         SAB  0   IAB  0   Ectopic  0   Multiple  0   Live Births              Past Medical History:  Diagnosis Date   Chicken pox    Concussion    HELLP (hemolytic anemia/elev liver enzymes/low platelets in pregnancy)     Past Surgical History:  Procedure Laterality Date   ADENOIDECTOMY  2005   TONSILLECTOMY     TONSILLECTOMY  2005    Family History  Problem Relation Age of Onset   Healthy Mother    Hypertension Father    Cancer Maternal Grandmother     Social History   Tobacco Use   Smoking status: Never   Smokeless tobacco: Never  Vaping Use   Vaping Use: Never used  Substance Use Topics   Alcohol use: Never   Drug use: No    Allergies: No Known Allergies  No medications prior to admission.    Review of Systems  Constitutional: Negative.   Respiratory: Negative.    Cardiovascular: Negative.   Gastrointestinal: Negative.   Genitourinary: Negative.   Neurological: Negative.    Physical Exam   Patient Vitals for the past 24 hrs:  BP Temp Temp src Pulse Resp SpO2   02/14/22 1330 117/87 -- -- (!) 108 -- --  02/14/22 1315 125/87 -- -- 95 -- 100 %  02/14/22 1300 129/83 -- -- 94 -- 100 %  02/14/22 1245 127/83 -- -- (!) 102 -- 99 %  02/14/22 1230 130/79 -- -- 97 -- 99 %  02/14/22 1222 117/73 -- -- (!) 103 -- --  02/14/22 1220 -- -- -- -- -- 99 %  02/14/22 1213 129/78 98.9 F (37.2 C) Oral (!) 104 17 --  02/14/22 1210 -- -- -- -- -- 99 %   Physical Exam Vitals and nursing note reviewed.  Constitutional:      General: She is not in acute distress. Eyes:     Extraocular Movements: Extraocular movements intact.     Pupils: Pupils are equal, round, and reactive to light.  Cardiovascular:     Rate and Rhythm: Normal rate.  Pulmonary:     Effort: Pulmonary effort is normal.  Abdominal:     Palpations: Abdomen is soft.     Tenderness: There is no abdominal tenderness.  Musculoskeletal:     Cervical back: Normal range of motion.  Skin:  General: Skin is warm and dry.  Neurological:     General: No focal deficit present.     Mental Status: She is alert and oriented to person, place, and time.  Psychiatric:        Mood and Affect: Mood normal.        Behavior: Behavior normal.        Thought Content: Thought content normal.        Judgment: Judgment normal.    NST FHR: 145 bpm, moderate variability, +15x15 accels, no decels Toco: irregular  MAU Course  Procedures NST  MDM CBC, CMP, UPCR ordered Serial BP's BP's normotensive throughout entirety of today's visit. She is asymptomatic. Labs are all wnl besides being mildly anemic. NST is reactive and reassuring. She is not feeling contractions. At this time, I have a low suspicion for pre-eclampsia. She has an appointment tomorrow at [facility].    Assessment and Plan  Elevated blood pressure [redacted] weeks gestation of pregnancy   - Discharge home in stable condition - Strict return precautions reviewed. Return to MAU sooner or as needed for worsening/new symptoms - Keep OB  appointment as scheduled tomorrow 7/20     Brand Males, CNM 02/14/2022, 2:08 PM

## 2022-02-14 NOTE — MAU Note (Signed)
Brenda Mason is a 26 y.o. at [redacted]w[redacted]d here in MAU reporting: sent from office.  Hx of HELLP with last preg, was 22wks, BP was never an issue.  BP elevated at MFM (has been elevated at MFM in the past), was normal per Dr Rana Snare  at office today, he went her in for baseline pre E work up.  Pt denies HA, visual changes, epigastric pain.  Reports slight increase in swelling the last couple weeks as it has gotten hotter.  Denies bleeding, LOF, reports +FM  Onset of complaint: today Pain score: none Vitals:   02/14/22 1210 02/14/22 1213  BP:  129/78  Pulse:  (!) 104  Resp:  17  Temp:  98.9 F (37.2 C)  SpO2: 99%      FHT:145 Lab orders placed from triage:  PCR

## 2022-02-14 NOTE — Progress Notes (Signed)
Patient has appointment with Dr. Rana Snare at 10:30

## 2022-02-15 LAB — OB RESULTS CONSOLE GBS: GBS: NEGATIVE

## 2022-02-20 ENCOUNTER — Telehealth (HOSPITAL_COMMUNITY): Payer: Self-pay | Admitting: *Deleted

## 2022-02-20 NOTE — Telephone Encounter (Signed)
Preadmission screen  

## 2022-02-21 ENCOUNTER — Ambulatory Visit: Payer: 59 | Admitting: *Deleted

## 2022-02-21 ENCOUNTER — Ambulatory Visit: Payer: 59 | Attending: Maternal & Fetal Medicine

## 2022-02-21 ENCOUNTER — Encounter (HOSPITAL_COMMUNITY): Payer: Self-pay

## 2022-02-21 VITALS — BP 140/93 | HR 104

## 2022-02-21 DIAGNOSIS — Z8759 Personal history of other complications of pregnancy, childbirth and the puerperium: Secondary | ICD-10-CM | POA: Insufficient documentation

## 2022-02-21 DIAGNOSIS — O09293 Supervision of pregnancy with other poor reproductive or obstetric history, third trimester: Secondary | ICD-10-CM

## 2022-02-21 DIAGNOSIS — O133 Gestational [pregnancy-induced] hypertension without significant proteinuria, third trimester: Secondary | ICD-10-CM | POA: Diagnosis present

## 2022-02-21 DIAGNOSIS — Z3A36 36 weeks gestation of pregnancy: Secondary | ICD-10-CM | POA: Diagnosis not present

## 2022-02-21 DIAGNOSIS — O358XX0 Maternal care for other (suspected) fetal abnormality and damage, not applicable or unspecified: Secondary | ICD-10-CM | POA: Diagnosis not present

## 2022-02-22 ENCOUNTER — Encounter (HOSPITAL_COMMUNITY): Payer: Self-pay | Admitting: *Deleted

## 2022-02-22 ENCOUNTER — Telehealth (HOSPITAL_COMMUNITY): Payer: Self-pay | Admitting: *Deleted

## 2022-02-22 NOTE — Telephone Encounter (Signed)
Preadmission screen  

## 2022-02-28 ENCOUNTER — Other Ambulatory Visit: Payer: Self-pay

## 2022-03-01 ENCOUNTER — Encounter (HOSPITAL_COMMUNITY): Payer: Self-pay | Admitting: Obstetrics and Gynecology

## 2022-03-01 ENCOUNTER — Inpatient Hospital Stay (HOSPITAL_COMMUNITY): Payer: 59 | Admitting: Anesthesiology

## 2022-03-01 ENCOUNTER — Other Ambulatory Visit: Payer: Self-pay

## 2022-03-01 ENCOUNTER — Inpatient Hospital Stay (HOSPITAL_COMMUNITY)
Admission: AD | Admit: 2022-03-01 | Discharge: 2022-03-03 | DRG: 807 | Disposition: A | Discharge status: Home / Self Care | Payer: 59 | Attending: Obstetrics and Gynecology | Admitting: Obstetrics and Gynecology

## 2022-03-01 ENCOUNTER — Inpatient Hospital Stay (HOSPITAL_COMMUNITY): Payer: 59

## 2022-03-01 DIAGNOSIS — O139 Gestational [pregnancy-induced] hypertension without significant proteinuria, unspecified trimester: Principal | ICD-10-CM | POA: Diagnosis present

## 2022-03-01 DIAGNOSIS — Z3A37 37 weeks gestation of pregnancy: Secondary | ICD-10-CM

## 2022-03-01 DIAGNOSIS — O134 Gestational [pregnancy-induced] hypertension without significant proteinuria, complicating childbirth: Principal | ICD-10-CM | POA: Diagnosis present

## 2022-03-01 DIAGNOSIS — O9081 Anemia of the puerperium: Secondary | ICD-10-CM | POA: Diagnosis not present

## 2022-03-01 LAB — COMPREHENSIVE METABOLIC PANEL
ALT: 13 U/L (ref 0–44)
AST: 25 U/L (ref 15–41)
Albumin: 2.7 g/dL — ABNORMAL LOW (ref 3.5–5.0)
Alkaline Phosphatase: 206 U/L — ABNORMAL HIGH (ref 38–126)
Anion gap: 9 (ref 5–15)
BUN: 6 mg/dL (ref 6–20)
CO2: 20 mmol/L — ABNORMAL LOW (ref 22–32)
Calcium: 8.9 mg/dL (ref 8.9–10.3)
Chloride: 107 mmol/L (ref 98–111)
Creatinine, Ser: 0.66 mg/dL (ref 0.44–1.00)
GFR, Estimated: >60 mL/min (ref >60)
Glucose, Bld: 94 mg/dL (ref 70–99)
Potassium: 3.7 mmol/L (ref 3.5–5.1)
Sodium: 136 mmol/L (ref 135–145)
Total Bilirubin: 0.6 mg/dL (ref 0.3–1.2)
Total Protein: 5.7 g/dL — ABNORMAL LOW (ref 6.5–8.1)

## 2022-03-01 LAB — URIC ACID: Uric Acid, Serum: 4.3 mg/dL (ref 2.5–7.1)

## 2022-03-01 LAB — CBC
HCT: 32.6 % — ABNORMAL LOW (ref 36.0–46.0)
HCT: 32.8 % — ABNORMAL LOW (ref 36.0–46.0)
HCT: 32.6 % — ABNORMAL LOW (ref 36.0–46.0)
Hemoglobin: 10.5 g/dL — ABNORMAL LOW (ref 12.0–15.0)
Hemoglobin: 10.8 g/dL — ABNORMAL LOW (ref 12.0–15.0)
Hemoglobin: 10.8 g/dL — ABNORMAL LOW (ref 12.0–15.0)
MCH: 28 pg (ref 26.0–34.0)
MCH: 28.3 pg (ref 26.0–34.0)
MCH: 28.3 pg (ref 26.0–34.0)
MCHC: 32.2 g/dL (ref 30.0–36.0)
MCHC: 32.9 g/dL (ref 30.0–36.0)
MCHC: 33.1 g/dL (ref 30.0–36.0)
MCV: 86.9 fL (ref 80.0–100.0)
MCV: 86.1 fL (ref 80.0–100.0)
MCV: 85.6 fL (ref 80.0–100.0)
Platelets: 147 10*3/uL — ABNORMAL LOW (ref 150–400)
Platelets: 149 10*3/uL — ABNORMAL LOW (ref 150–400)
Platelets: 140 10*3/uL — ABNORMAL LOW (ref 150–400)
RBC: 3.75 MIL/uL — ABNORMAL LOW (ref 3.87–5.11)
RBC: 3.81 MIL/uL — ABNORMAL LOW (ref 3.87–5.11)
RBC: 3.81 MIL/uL — ABNORMAL LOW (ref 3.87–5.11)
RDW: 14 % (ref 11.5–15.5)
RDW: 14.1 % (ref 11.5–15.5)
RDW: 14 % (ref 11.5–15.5)
WBC: 10.8 10*3/uL — ABNORMAL HIGH (ref 4.0–10.5)
WBC: 12 10*3/uL — ABNORMAL HIGH (ref 4.0–10.5)
WBC: 14.9 10*3/uL — ABNORMAL HIGH (ref 4.0–10.5)
nRBC: 0.2 % (ref 0.0–0.2)
nRBC: 0 % (ref 0.0–0.2)
nRBC: 0 % (ref 0.0–0.2)

## 2022-03-01 LAB — TYPE AND SCREEN
ABO/RH(D): B POS
Antibody Screen: NEGATIVE

## 2022-03-01 LAB — LACTATE DEHYDROGENASE: LDH: 197 U/L — ABNORMAL HIGH (ref 98–192)

## 2022-03-01 LAB — RPR: RPR Ser Ql: NONREACTIVE

## 2022-03-01 MED ORDER — ONDANSETRON HCL 4 MG/2ML IJ SOLN
4.0000 mg | Freq: Four times a day (QID) | INTRAMUSCULAR | Status: DC | PRN
Start: 2022-03-01 — End: 2022-03-01

## 2022-03-01 MED ORDER — TERBUTALINE SULFATE 1 MG/ML IJ SOLN: 0.2500 mg | Freq: Once | INTRAMUSCULAR | Status: DC | PRN

## 2022-03-01 MED ORDER — SENNOSIDES-DOCUSATE SODIUM 8.6-50 MG PO TABS
2.0000 | ORAL_TABLET | Freq: Every day | ORAL | Status: DC
Start: 2022-03-02 — End: 2022-03-03
  Administered 2022-03-02: 2 via ORAL
  Filled 2022-03-01 (×2): qty 2

## 2022-03-01 MED ORDER — LACTATED RINGERS IV SOLN: 500.0000 mL | Freq: Once | INTRAVENOUS | Status: AC

## 2022-03-01 MED ORDER — ACETAMINOPHEN 325 MG PO TABS: 650.0000 mg | ORAL_TABLET | ORAL | Status: DC | PRN

## 2022-03-01 MED ORDER — COCONUT OIL OIL: 1.0000 | TOPICAL_OIL | Status: DC | PRN

## 2022-03-01 MED ORDER — ONDANSETRON HCL 4 MG PO TABS: 4.0000 mg | ORAL_TABLET | ORAL | Status: DC | PRN

## 2022-03-01 MED ORDER — LIDOCAINE HCL (PF) 1 % IJ SOLN
INTRAMUSCULAR | Status: DC | PRN
  Administered 2022-03-01: 2 mL via EPIDURAL
  Administered 2022-03-01: 10 mL via EPIDURAL

## 2022-03-01 MED ORDER — DIPHENHYDRAMINE HCL 25 MG PO CAPS: 25.0000 mg | ORAL_CAPSULE | Freq: Four times a day (QID) | ORAL | Status: DC | PRN

## 2022-03-01 MED ORDER — OXYCODONE-ACETAMINOPHEN 5-325 MG PO TABS: 1.0000 | ORAL_TABLET | ORAL | Status: DC | PRN

## 2022-03-01 MED ORDER — LACTATED RINGERS IV SOLN: INTRAVENOUS | Status: DC

## 2022-03-01 MED ORDER — OXYCODONE-ACETAMINOPHEN 5-325 MG PO TABS: 2.0000 | ORAL_TABLET | ORAL | Status: DC | PRN

## 2022-03-01 MED ORDER — TETANUS-DIPHTH-ACELL PERTUSSIS 5-2.5-18.5 LF-MCG/0.5 IM SUSY: 0.5000 mL | PREFILLED_SYRINGE | Freq: Once | INTRAMUSCULAR | Status: DC

## 2022-03-01 MED ORDER — PHENYLEPHRINE 80 MCG/ML (10ML) SYRINGE FOR IV PUSH (FOR BLOOD PRESSURE SUPPORT): 80.0000 ug | PREFILLED_SYRINGE | INTRAVENOUS | Status: DC | PRN

## 2022-03-01 MED ORDER — DIPHENHYDRAMINE HCL 50 MG/ML IJ SOLN: 12.5000 mg | INTRAMUSCULAR | Status: DC | PRN

## 2022-03-01 MED ORDER — MISOPROSTOL 50MCG HALF TABLET
50.0000 ug | ORAL_TABLET | ORAL | Status: AC
  Administered 2022-03-01 (×2): 50 ug via ORAL
  Filled 2022-03-01 (×2): qty 1

## 2022-03-01 MED ORDER — TRANEXAMIC ACID-NACL 1000-0.7 MG/100ML-% IV SOLN
1000.0000 mg | INTRAVENOUS | Status: AC
Start: 2022-03-01 — End: 2022-03-01

## 2022-03-01 MED ORDER — IBUPROFEN 600 MG PO TABS
600.0000 mg | ORAL_TABLET | Freq: Four times a day (QID) | ORAL | Status: DC
  Administered 2022-03-02 – 2022-03-03 (×4): 600 mg via ORAL
  Filled 2022-03-01 (×7): qty 1

## 2022-03-01 MED ORDER — BENZOCAINE-MENTHOL 20-0.5 % EX AERO: 1.0000 | INHALATION_SPRAY | CUTANEOUS | Status: DC | PRN

## 2022-03-01 MED ORDER — FENTANYL-BUPIVACAINE-NACL 0.5-0.125-0.9 MG/250ML-% EP SOLN
12.0000 mL/h | EPIDURAL | Status: DC | PRN
  Filled 2022-03-01: qty 250

## 2022-03-01 MED ORDER — WITCH HAZEL-GLYCERIN EX PADS: 1.0000 | MEDICATED_PAD | CUTANEOUS | Status: DC | PRN

## 2022-03-01 MED ORDER — ONDANSETRON HCL 4 MG/2ML IJ SOLN: 4.0000 mg | INTRAMUSCULAR | Status: DC | PRN

## 2022-03-01 MED ORDER — FENTANYL-BUPIVACAINE-NACL 0.5-0.125-0.9 MG/250ML-% EP SOLN
EPIDURAL | Status: DC | PRN
  Administered 2022-03-01: 12 mL/h via EPIDURAL

## 2022-03-01 MED ORDER — MEASLES, MUMPS & RUBELLA VAC IJ SOLR: 0.5000 mL | Freq: Once | INTRAMUSCULAR | Status: DC

## 2022-03-01 MED ORDER — LIDOCAINE HCL (PF) 1 % IJ SOLN
30.0000 mL | INTRAMUSCULAR | Status: AC | PRN
  Administered 2022-03-01: 30 mL via SUBCUTANEOUS
  Filled 2022-03-01: qty 30

## 2022-03-01 MED ORDER — OXYTOCIN-SODIUM CHLORIDE 30-0.9 UT/500ML-% IV SOLN
2.5000 [IU]/h | INTRAVENOUS | Status: DC
Start: 2022-03-01 — End: 2022-03-01
  Filled 2022-03-01: qty 500

## 2022-03-01 MED ORDER — SOD CITRATE-CITRIC ACID 500-334 MG/5ML PO SOLN
30.0000 mL | ORAL | Status: DC | PRN
Start: 2022-03-01 — End: 2022-03-01

## 2022-03-01 MED ORDER — PRENATAL MULTIVITAMIN CH
1.0000 | ORAL_TABLET | Freq: Every day | ORAL | Status: DC
Start: 2022-03-02 — End: 2022-03-03
  Administered 2022-03-02: 1 via ORAL
  Filled 2022-03-01: qty 1

## 2022-03-01 MED ORDER — LACTATED RINGERS IV SOLN: 500.0000 mL | INTRAVENOUS | Status: DC | PRN

## 2022-03-01 MED ORDER — TRANEXAMIC ACID-NACL 1000-0.7 MG/100ML-% IV SOLN
INTRAVENOUS | Status: AC
  Administered 2022-03-01: 1000 mg via INTRAVENOUS
  Filled 2022-03-01: qty 100

## 2022-03-01 MED ORDER — MISOPROSTOL 50MCG HALF TABLET: 50.0000 ug | ORAL_TABLET | ORAL | Status: DC

## 2022-03-01 MED ORDER — ZOLPIDEM TARTRATE 5 MG PO TABS
5.0000 mg | ORAL_TABLET | Freq: Every evening | ORAL | Status: DC | PRN
Start: 2022-03-01 — End: 2022-03-01

## 2022-03-01 MED ORDER — DIBUCAINE (PERIANAL) 1 % EX OINT: 1.0000 | TOPICAL_OINTMENT | CUTANEOUS | Status: DC | PRN

## 2022-03-01 MED ORDER — OXYTOCIN-SODIUM CHLORIDE 30-0.9 UT/500ML-% IV SOLN
1.0000 m[IU]/min | INTRAVENOUS | Status: DC
  Administered 2022-03-01: 2 m[IU]/min via INTRAVENOUS

## 2022-03-01 MED ORDER — ZOLPIDEM TARTRATE 5 MG PO TABS: 5.0000 mg | ORAL_TABLET | Freq: Every evening | ORAL | Status: DC | PRN

## 2022-03-01 MED ORDER — SIMETHICONE 80 MG PO CHEW: 80.0000 mg | CHEWABLE_TABLET | ORAL | Status: DC | PRN

## 2022-03-01 MED ORDER — EPHEDRINE 5 MG/ML INJ: 10.0000 mg | INTRAVENOUS | Status: DC | PRN

## 2022-03-01 MED ORDER — MEDROXYPROGESTERONE ACETATE 150 MG/ML IM SUSP: 150.0000 mg | INTRAMUSCULAR | Status: DC | PRN

## 2022-03-01 MED ORDER — OXYTOCIN BOLUS FROM INFUSION
333.0000 mL | Freq: Once | INTRAVENOUS | Status: AC
Start: 2022-03-01 — End: 2022-03-01
  Administered 2022-03-01: 333 mL via INTRAVENOUS

## 2022-03-01 NOTE — Lactation Note (Signed)
This note was copied from a baby's chart. Lactation Consultation Note  Patient Name: Brenda Mason [id number] Date: 03/01/2022 Reason for consult: L&D Initial assessment;Early term 37-38.6wks;1st time breastfeeding;Primapara Age:24 hours  Baby cues and rooting when LC entered.  LC provided support pillows for easier latch.   Baby grasped breast after a few attempts.  Good rhtythmic sucking noted.  Parent states she leaked milk during pregnancy and also asked questions regarding feedings.    LC encouraged STS and feeding with cues, 8-12 times in 24 hours.    Hand expression and BF basics to be reviewed on MBU.      Maternal Data Has patient been taught Hand Expression?: No  Feeding Mother's Current Feeding Choice: Breast Milk  LATCH Score Latch: Repeated attempts needed to sustain latch, nipple held in mouth throughout feeding, stimulation needed to elicit sucking reflex.  Audible Swallowing: A few with stimulation  Type of Nipple: Everted at rest and after stimulation  Comfort (Breast/Nipple): Soft / non-tender  Hold (Positioning): Assistance needed to correctly position infant at breast and maintain latch.  LATCH Score: 7   Lactation Tools Discussed/Used    Interventions Interventions: Breast feeding basics reviewed;Skin to skin;Adjust position;Support pillows  Discharge    Consult Status Consult Status: Follow-up from L&D    Maryruth Hancock [facility] 03/01/2022, 4:26 PM

## 2022-03-01 NOTE — Lactation Note (Signed)
This note was copied from a baby's chart. Lactation Consultation Note  Patient Name: Brenda Mason [id number] Date: 03/01/2022 Reason for consult: Initial assessment;Early term 37-38.6wks Age:24 hours  Breast feeding parent stated she just BF not long ago and he done well. LC asked if she would call for [facility] for next feeding. Newborn feeding habits, STS, I&O reviewed. Encouraged to feed baby 8-12 times/24 hours and with feeding cues.   Mom denies pain during latching. LC awaits on call for next feeding.  Maternal Data Does the patient have breastfeeding experience prior to this delivery?: No  Feeding    LATCH Score                    Lactation Tools Discussed/Used    Interventions Interventions: [facility] Services brochure  Discharge    Consult Status Consult Status: Follow-up Date: 03/02/22 Follow-up type: In-patient    Charyl Dancer 03/01/2022, 10:12 PM

## 2022-03-01 NOTE — H&P (Signed)
Brenda Mason is a 26 y.o. female presenting for IOL due to gestational HTN followed by MFM for IUGR which has resolved.  They recommended delivery at 37 weeks.  Currently, no PIH sxs and normal labs.  Previous preg with severe HELLP syndrome with previable delivery for this.  GSB neg. OB History     Gravida  2   Para  1   Term  0   Preterm  1   AB  0   Living         SAB  0   IAB  0   Ectopic  0   Multiple  0   Live Births             Past Medical History:  Diagnosis Date   Chicken pox    Concussion    HELLP (hemolytic anemia/elev liver enzymes/low platelets in pregnancy)    Pregnancy induced hypertension    Past Surgical History:  Procedure Laterality Date   ADENOIDECTOMY  2005   TONSILLECTOMY     TONSILLECTOMY  2005   Family History: family history includes Cancer in her maternal grandmother; Healthy in her mother; Hypertension in her father. Social History:  reports that she has never smoked. She has never used smokeless tobacco. She reports that she does not drink alcohol and does not use drugs.     Maternal Diabetes: No Genetic Screening: Normal Maternal Ultrasounds/Referrals: IUGR previously, resolved Fetal Ultrasounds or other Referrals:  Referred to Materal Fetal Medicine  Maternal Substance Abuse:  No Significant Maternal Medications:  None Significant Maternal Lab Results:  Group B Strep negative Number of Prenatal Visits:greater than 3 verified prenatal visits Other Comments:  None  Review of Systems History Dilation: 3.5 Effacement (%): 80 Station: -2 Exam by:: Dr. Rana Snare Blood pressure (!) 143/98, pulse 95, temperature 97.6 F (36.4 C), temperature source Oral, resp. rate 17, height 5\' 5"  (1.651 m), weight 97.5 kg, last menstrual period 06/16/2021, unknown if currently breastfeeding. Exam Physical Exam  Vitals and nursing note reviewed. Exam conducted with a chaperone present.  Constitutional:      Appearance: Normal appearance.   HENT:     Head: Normocephalic.  Eyes:     Pupils: Pupils are equal, round, and reactive to light.  Cardiovascular:     Rate and Rhythm: Normal rate and regular rhythm.     Pulses: Normal pulses.  Abdominal:     General: Abdomen is Gravid, nontender Neurological:     Mental Status: She is alert.  Prenatal labs: ABO, Rh: --/--/B POS (08/03 0018) Antibody: NEG (08/03 0018) Rubella: Immune (01/23 0000) RPR: NON REACTIVE (08/03 0007)  HBsAg: Negative (01/23 0000)  HIV: Non-reactive (01/23 0000)  GBS:     Assessment/Plan: IUP at [redacted] weeks Gestational HTN - labs normal, BP not in severe range and no PIH sxs IOL - s/p cytotec, now AROM clear.  Brenda Mason is planning epidural Anticipate SVD   Brenda Mason 03/01/2022, 9:08 AM

## 2022-03-01 NOTE — Anesthesia Procedure Notes (Signed)
Epidural Patient location during procedure: OB Start time: 03/01/2022 9:46 AM End time: 03/01/2022 9:59 AM  Staffing Anesthesiologist: Lannie Fields, DO Performed: anesthesiologist   Preanesthetic Checklist Completed: patient identified, IV checked, risks and benefits discussed, monitors and equipment checked, pre-op evaluation and timeout performed  Epidural Patient position: sitting Prep: DuraPrep and site prepped and draped Patient monitoring: continuous pulse ox, blood pressure, heart rate and cardiac monitor Approach: midline Location: L3-L4 Injection technique: LOR air  Needle:  Needle type: Tuohy  Needle gauge: 17 G Needle length: 9 cm Needle insertion depth: 7 cm Catheter type: closed end flexible Catheter size: 19 Gauge Catheter at skin depth: 12 cm Test dose: negative  Assessment Sensory level: T8 Events: blood not aspirated, injection not painful, no injection resistance, no paresthesia and negative IV test  Additional Notes Patient identified. Risks/Benefits/Options discussed with patient including but not limited to bleeding, infection, nerve damage, paralysis, failed block, incomplete pain control, headache, blood pressure changes, nausea, vomiting, reactions to medication both or allergic, itching and postpartum back pain. Confirmed with bedside nurse the patient's most recent platelet count. Confirmed with patient that they are not currently taking any anticoagulation, have any bleeding history or any family history of bleeding disorders. Patient expressed understanding and wished to proceed. All questions were answered. Sterile technique was used throughout the entire procedure. Please see nursing notes for vital signs. Test dose was given through epidural catheter and negative prior to continuing to dose epidural or start infusion. Warning signs of high block given to the patient including shortness of breath, tingling/numbness in hands, complete motor block,  or any concerning symptoms with instructions to call for help. Patient was given instructions on fall risk and not to get out of bed. All questions and concerns addressed with instructions to call with any issues or inadequate analgesia.  Reason for block:procedure for pain

## 2022-03-01 NOTE — Anesthesia Preprocedure Evaluation (Signed)
Anesthesia Evaluation  Patient identified by MRN, date of birth, ID band Patient awake    Reviewed: Allergy & Precautions, Patient's Chart, lab work & pertinent test results  Airway Mallampati: II  TM Distance: >3 FB Neck ROM: Full    Dental no notable dental hx.    Pulmonary neg pulmonary ROS,    Pulmonary exam normal breath sounds clear to auscultation       Cardiovascular hypertension, Pt. on medications Normal cardiovascular exam Rhythm:Regular Rate:Normal     Neuro/Psych negative neurological ROS  negative psych ROS   GI/Hepatic negative GI ROS, Neg liver ROS,   Endo/Other  BMI 36  Renal/GU negative Renal ROS  negative genitourinary   Musculoskeletal negative musculoskeletal ROS (+)   Abdominal (+) + obese,   Peds negative pediatric ROS (+)  Hematology  (+) Blood dyscrasia, anemia , Hb 10.8, plt 149 Gestational thrombocytopenia    Anesthesia Other Findings   Reproductive/Obstetrics (+) Pregnancy 1st epidural, previous pregnancy w/ severe HELLP                             Anesthesia Physical Anesthesia Plan  ASA: 2  Anesthesia Plan: Epidural   Post-op Pain Management:    Induction:   PONV Risk Score and Plan: Treatment may vary due to age or medical condition  Airway Management Planned: Natural Airway  Additional Equipment: None  Intra-op Plan:   Post-operative Plan:   Informed Consent: I have reviewed the patients History and Physical, chart, labs and discussed the procedure including the risks, benefits and alternatives for the proposed anesthesia with the patient or authorized representative who has indicated his/her understanding and acceptance.       Plan Discussed with:   Anesthesia Plan Comments:         Anesthesia Quick Evaluation

## 2022-03-02 LAB — CBC
HCT: 29.5 % — ABNORMAL LOW (ref 36.0–46.0)
Hemoglobin: 9.5 g/dL — ABNORMAL LOW (ref 12.0–15.0)
MCH: 27.9 pg (ref 26.0–34.0)
MCHC: 32.2 g/dL (ref 30.0–36.0)
MCV: 86.8 fL (ref 80.0–100.0)
Platelets: 139 10*3/uL — ABNORMAL LOW (ref 150–400)
RBC: 3.4 MIL/uL — ABNORMAL LOW (ref 3.87–5.11)
RDW: 14.6 % (ref 11.5–15.5)
WBC: 15.5 10*3/uL — ABNORMAL HIGH (ref 4.0–10.5)
nRBC: 0 % (ref 0.0–0.2)

## 2022-03-02 MED ORDER — DOCUSATE SODIUM 100 MG PO CAPS
100.0000 mg | ORAL_CAPSULE | Freq: Every day | ORAL | Status: DC
  Administered 2022-03-02: 100 mg via ORAL
  Filled 2022-03-02 (×2): qty 1

## 2022-03-02 MED ORDER — FERROUS SULFATE 325 (65 FE) MG PO TABS
325.0000 mg | ORAL_TABLET | ORAL | Status: DC
  Administered 2022-03-02: 325 mg via ORAL
  Filled 2022-03-02: qty 1

## 2022-03-02 MED ORDER — IBUPROFEN 600 MG PO TABS: 600.0000 mg | ORAL_TABLET | Freq: Four times a day (QID) | ORAL | 0 refills | Status: DC

## 2022-03-02 MED ORDER — ACETAMINOPHEN 325 MG PO TABS: 650.0000 mg | ORAL_TABLET | ORAL | 0 refills | Status: AC | PRN

## 2022-03-02 NOTE — Progress Notes (Signed)
Spoke with Dr. Lorane Gell regarding discharge of patient. MD states it is okay to hold off on discharge until tomorrow.

## 2022-03-02 NOTE — Lactation Note (Signed)
This note was copied from a baby's chart. Lactation Consultation Note  Patient Name: Brenda Mason [id number] Date: 03/02/2022 Reason for consult: Follow-up assessment;1st time breastfeeding;Primapara;Early term 37-38.6wks Age:24 hours  P1: Early term infant at 37+4 weeks Feeding preference: Breast  Visited with birth parent and reviewed breast feeding basics.  Taught hand expression and finger fed colostrum drops to "Brenda Mason."  With drops he began to awaken; offered to assist with latching and birth parent receptive.  Assisted to latch easily in the football hold and observed him feeding well for 7 minutes before leaving the room; intermittent swallows heard and birth parent able to identify swallows.  Encouraged to feed 8-12 times/24 hours or sooner if "Brenda Mason" shows cues.  Discussed possible sleepiness after his circumcision today.  Suggested birth parent call her RN/LC for latch assistance as needed.     Maternal Data Has patient been taught Hand Expression?: Yes Does the patient have breastfeeding experience prior to this delivery?: No  Feeding Mother's Current Feeding Choice: Breast Milk  LATCH Score Latch: Grasps breast easily, tongue down, lips flanged, rhythmical sucking.  Audible Swallowing: A few with stimulation  Type of Nipple: Everted at rest and after stimulation  Comfort (Breast/Nipple): Soft / non-tender  Hold (Positioning): Assistance needed to correctly position infant at breast and maintain latch.  LATCH Score: 8   Lactation Tools Discussed/Used    Interventions Interventions: Breast feeding basics reviewed;Assisted with latch;Skin to skin;Breast massage;Hand express;Breast compression;Expressed milk;Position options;Support pillows;Adjust position;Education  Discharge Pump: Personal Brenda Mason) WIC Program: No  Consult Status Consult Status: Follow-up Date: 03/03/22 Follow-up type: In-patient    Brenda Mason 03/02/2022, 9:24 AM

## 2022-03-02 NOTE — Progress Notes (Signed)
Postpartum Progress Note  Post Partum Day 1 s/p spontaneous vaginal delivery.  Patient reports well-controlled pain, ambulating without difficulty, voiding spontaneously, tolerating PO.  Vaginal bleeding is appropriate.   Objective: Blood pressure 129/83, pulse 79, temperature 98.7 F (37.1 C), temperature source Oral, resp. rate 16, height 5\' 5"  (1.651 m), weight 97.5 kg, last menstrual period 06/16/2021, SpO2 95 %, unknown if currently breastfeeding.  Physical Exam:  General: alert and no distress Lochia: appropriate Uterine Fundus: firm DVT Evaluation: No evidence of DVT seen on physical exam.  Recent Labs    03/01/22 1625 03/02/22 0519  HGB 10.8* 9.5*  HCT 32.6* 29.5*    Assessment/Plan: Postpartum Day 1, s/p vaginal delivery. Continue routine postpartum care Gestational HTN with history of HELLP syndrome in prior pregnancy. Normotensive since delivery Labs reassuring since admission Acute blood loss anemia - Fe/Colace. No signs or symptoms of anemia.  Baby boy - Desires circ, will perform when cleared Lactation following Anticipate discharge home later today or tomorrow   LOS: 1 day   05/02/22 03/02/2022, 7:45 AM

## 2022-03-02 NOTE — Anesthesia Postprocedure Evaluation (Signed)
Anesthesia Post Note  Patient: Brenda Mason  Procedure(s) Performed: AN AD HOC LABOR EPIDURAL     Patient location during evaluation: Mother Baby Anesthesia Type: Epidural Level of consciousness: awake and alert Pain management: pain level controlled Vital Signs Assessment: post-procedure vital signs reviewed and stable Respiratory status: spontaneous breathing, nonlabored ventilation and respiratory function stable Cardiovascular status: stable Postop Assessment: no headache, no backache, epidural receding, no apparent nausea or vomiting, patient able to bend at knees, adequate PO intake and able to ambulate Anesthetic complications: no   No notable events documented.  Last Vitals:  Vitals:   03/02/22 0225 03/02/22 0636  BP: 106/68 129/83  Pulse: 98 79  Resp: 15 16  Temp: 37.3 C 37.1 C  SpO2: 95% 95%    Last Pain:  Vitals:   03/02/22 0636  TempSrc: Oral  PainSc: 0-No pain   Pain Goal:                   [place]

## 2022-03-03 NOTE — Lactation Note (Signed)
This note was copied from a baby's chart. Lactation Consultation Note  Patient Name: Brenda Mason [id number] Date: 03/03/2022   Age:24 hours  LC attempted to visit with the dyad, but the birthing parent was in the shower. The supporting parent asked for two bottles of formula. LC assisted the supporting parent and gave them formula.   LC left her name on the board and asked that the birthing parent call when she is ready to be seen by lactation.   Maternal Data    Feeding Nipple Type: Slow - flow  LATCH Score                    Lactation Tools Discussed/Used    Interventions    Discharge    Consult Status      Orvil Feil [place] [facility] 03/03/2022, 8:23 AM

## 2022-03-03 NOTE — Lactation Note (Signed)
This note was copied from a baby's chart. Lactation Consultation Note  Patient Name: Brenda Mason Date: 03/03/2022 Reason for consult: Follow-up assessment;Primapara;1st time breastfeeding;Difficult latch;Early term 37-38.6wks;Infant weight loss (6.31% WL) Age:24 hours  P1, Early Term, Infant Female, 6.31%WL  LC entered the room and the birthing parent was holding baby. Per birthing parent, baby was cluster feeding last night and was cranky at the breast. She states that since supplementing with formula, he has been doing better.   LC spoke with the birthing parent about putting baby to the breast prior to supplementing.   LC also suggested that she she outpatient LC. The birthing parent was receptive and a message was sent to outpatient LC.   LC reviewed milk storage guidelines.   LC also spoke with the parents about engorgement, warning signs, and infant I/O.   All questions were answered.   Current Feeding Plan:  Breastfeed/ Chest feed 8+ times in 24 hours according to feeding cues.  Put baby to the breast/chest prior to supplementing with formula.  Supplement with formula according to supplementation guidelines.  Watch infant I/O and call the pediatrician with questions or concerns.  Contact outpatient LC for assistance with breast/chest feeding.   Maternal Data    Feeding Mother's Current Feeding Choice: Breast Milk and Formula Nipple Type: Slow - flow  LATCH Score                    Lactation Tools Discussed/Used    Interventions Interventions: Breast feeding basics reviewed;Education  Discharge Discharge Education: Engorgement and breast care;Warning signs for feeding baby;Outpatient Epic message sent  Consult Status Consult Status: Complete Date: 03/03/22 Follow-up type: Call as needed    Brenda Mason 03/03/2022, 10:23 AM

## 2022-03-03 NOTE — Progress Notes (Signed)
Postpartum Progress Note  Post Partum Day 2 s/p spontaneous vaginal delivery.  Patient reports well-controlled pain, ambulating without difficulty, voiding spontaneously, tolerating PO.  Vaginal bleeding is appropriate.   Objective: Blood pressure 121/81, pulse 83, temperature 98.4 F (36.9 C), temperature source Oral, resp. rate 16, height 5\' 5"  (1.651 m), weight 97.5 kg, last menstrual period 06/16/2021, SpO2 100 %, unknown if currently breastfeeding.  Physical Exam:  General: alert and no distress Lochia: appropriate Uterine Fundus: firm DVT Evaluation: No evidence of DVT seen on physical exam.  Recent Labs    03/01/22 1625 03/02/22 0519  HGB 10.8* 9.5*  HCT 32.6* 29.5*     Assessment/Plan: Postpartum Day 1, s/p vaginal delivery. Continue routine postpartum care Gestational HTN with history of HELLP syndrome in prior pregnancy. Normotensive since delivery Labs reassuring since admission Acute blood loss anemia - Fe/Colace. No signs or symptoms of anemia.  Baby boy - s/p circ Lactation following Anticipate discharge home today   LOS: 2 days   05/02/22 03/03/2022, 6:46 AM

## 2022-03-04 NOTE — Discharge Summary (Signed)
Obstetric Discharge Summary  Brenda Mason is a 26 y.o. female that presented on 03/01/2022 for induction of labor for gestational hypertension. She has a history of HELLP syndrome with prior pregnancy.  She was admitted to labor and delivery for her induction.  Her labor course was uncomplicated and she delivered a viable female infant on 03/01/2022.  Her postpartum course was uncomplicated and on PPD#2, she reported well controlled pain, spontaneous voiding, ambulating without difficulty, and tolerating PO.  She was stable for discharge home on 03/03/2022 with plans for in-office follow up, inlcuding 1 week blood pressure check.  Hemoglobin  Date Value Ref Range Status  03/02/2022 9.5 (L) 12.0 - 15.0 g/dL Final   HCT  Date Value Ref Range Status  03/02/2022 29.5 (L) 36.0 - 46.0 % Final    Physical Exam:  General: alert and no distress Lochia: appropriate Uterine Fundus: firm DVT Evaluation: No evidence of DVT seen on physical exam.  Discharge Diagnoses: Term Pregnancy-delivered  Discharge Information: Date: 03/04/2022 Activity: Pelvic rest, as tolerated Diet: routine Medications: Tylenol, motrin, iron sulfate, colace Condition: stable Instructions: Refer to practice specific booklet.  Discussed prior to discharge.  Discharge to: Home  Follow-up Information     Brenda Mason, [facility] Follow up.   Why: Please follow up for a 6 week postpartum visit. Contact information: [address] [place] [address] [phone]                 Newborn Data: Live born female  Birth Weight: 6 lb 10.2 oz (3010 g) APGAR: 9, 9  Newborn Delivery   Birth date/time: 03/01/2022 15:16:00 Delivery type: Vaginal, Spontaneous      Home with mother.  Brenda Mason 03/04/2022, 3:56 PM

## 2022-03-05 LAB — SURGICAL PATHOLOGY

## 2022-03-09 ENCOUNTER — Telehealth (HOSPITAL_COMMUNITY): Payer: Self-pay | Admitting: *Deleted

## 2022-03-09 NOTE — Telephone Encounter (Signed)
Voice mailbox is full.  Duffy Rhody, RN 03-09-2022 at 9:20am

## 2022-07-30 NOTE — L&D Delivery Note (Signed)
Delivery Note At 4:15 AM a viable female was delivered via Vaginal, Spontaneous (Presentation: Left Occiput Anterior).  APGAR: 9, 9; weight pending.   Placenta status: Spontaneous, Intact.  Cord: 3 vessels with the following complications: Short.  Cord pH: n/a  Anesthesia: 1% lidocaine Episiotomy: None Lacerations: 2nd degree Suture Repair: 3.0 vicryl rapide Est. Blood Loss (mL): 202  Mom to postpartum.  Baby to Couplet care / Skin to Skin.  Mitchel Honour 06/05/2023, 4:53 AM

## 2022-11-09 ENCOUNTER — Inpatient Hospital Stay (HOSPITAL_COMMUNITY): Payer: 59

## 2022-11-09 ENCOUNTER — Inpatient Hospital Stay (HOSPITAL_COMMUNITY)
Admission: AD | Admit: 2022-11-09 | Discharge: 2022-11-09 | Disposition: A | Discharge status: Home / Self Care | Payer: 59 | Attending: Obstetrics and Gynecology | Admitting: Obstetrics and Gynecology

## 2022-11-09 ENCOUNTER — Encounter (HOSPITAL_COMMUNITY): Payer: Self-pay | Admitting: *Deleted

## 2022-11-09 DIAGNOSIS — O26891 Other specified pregnancy related conditions, first trimester: Secondary | ICD-10-CM | POA: Insufficient documentation

## 2022-11-09 DIAGNOSIS — M79645 Pain in left finger(s): Secondary | ICD-10-CM | POA: Diagnosis present

## 2022-11-09 DIAGNOSIS — S6992XA Unspecified injury of left wrist, hand and finger(s), initial encounter: Secondary | ICD-10-CM

## 2022-11-09 DIAGNOSIS — Z3A08 8 weeks gestation of pregnancy: Secondary | ICD-10-CM | POA: Insufficient documentation

## 2022-11-09 DIAGNOSIS — Z3491 Encounter for supervision of normal pregnancy, unspecified, first trimester: Secondary | ICD-10-CM

## 2022-11-09 MED ORDER — ACETAMINOPHEN 500 MG PO TABS
1000.0000 mg | ORAL_TABLET | Freq: Once | ORAL | Status: AC
Start: 2022-11-09 — End: 2022-11-09
  Administered 2022-11-09: 1000 mg via ORAL
  Filled 2022-11-09: qty 2
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
## 2022-11-09 NOTE — Discharge Instructions (Signed)
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
## 2022-11-19 LAB — OB RESULTS CONSOLE RPR: RPR: NONREACTIVE

## 2022-11-19 LAB — OB RESULTS CONSOLE HIV ANTIBODY (ROUTINE TESTING): HIV: NONREACTIVE

## 2022-11-19 LAB — OB RESULTS CONSOLE HEPATITIS B SURFACE ANTIGEN: Hepatitis B Surface Ag: NEGATIVE

## 2022-11-19 LAB — OB RESULTS CONSOLE RUBELLA ANTIBODY, IGM: Rubella: IMMUNE

## 2022-11-19 LAB — HEPATITIS C ANTIBODY: HCV Ab: NEGATIVE

## 2022-11-23 LAB — OB RESULTS CONSOLE GC/CHLAMYDIA
Chlamydia: NEGATIVE
Neisseria Gonorrhea: NEGATIVE

## 2023-05-31 LAB — OB RESULTS CONSOLE GBS: GBS: NEGATIVE

## 2023-06-05 ENCOUNTER — Encounter (HOSPITAL_COMMUNITY): Payer: Self-pay | Admitting: Anesthesiology

## 2023-06-05 ENCOUNTER — Other Ambulatory Visit: Payer: Self-pay

## 2023-06-05 ENCOUNTER — Inpatient Hospital Stay (HOSPITAL_COMMUNITY)
Admission: AD | Admit: 2023-06-05 | Discharge: 2023-06-06 | DRG: 807 | Disposition: A | Discharge status: Home / Self Care | Payer: 59 | Attending: Obstetrics & Gynecology | Admitting: Obstetrics & Gynecology

## 2023-06-05 ENCOUNTER — Encounter (HOSPITAL_COMMUNITY): Payer: Self-pay | Admitting: Obstetrics & Gynecology

## 2023-06-05 DIAGNOSIS — Z3A38 38 weeks gestation of pregnancy: Secondary | ICD-10-CM | POA: Diagnosis not present

## 2023-06-05 DIAGNOSIS — Z8249 Family history of ischemic heart disease and other diseases of the circulatory system: Secondary | ICD-10-CM | POA: Diagnosis not present

## 2023-06-05 DIAGNOSIS — Z3490 Encounter for supervision of normal pregnancy, unspecified, unspecified trimester: Principal | ICD-10-CM

## 2023-06-05 DIAGNOSIS — O26893 Other specified pregnancy related conditions, third trimester: Secondary | ICD-10-CM | POA: Diagnosis present

## 2023-06-05 LAB — RPR: RPR Ser Ql: NONREACTIVE

## 2023-06-05 LAB — TYPE AND SCREEN
ABO/RH(D): B POS
Antibody Screen: NEGATIVE

## 2023-06-05 LAB — CBC
HCT: 34.3 % — ABNORMAL LOW (ref 36.0–46.0)
HCT: 33.1 % — ABNORMAL LOW (ref 36.0–46.0)
Hemoglobin: 10.8 g/dL — ABNORMAL LOW (ref 12.0–15.0)
Hemoglobin: 10.3 g/dL — ABNORMAL LOW (ref 12.0–15.0)
MCH: 25.4 pg — ABNORMAL LOW (ref 26.0–34.0)
MCH: 24.9 pg — ABNORMAL LOW (ref 26.0–34.0)
MCHC: 31.5 g/dL (ref 30.0–36.0)
MCHC: 31.1 g/dL (ref 30.0–36.0)
MCV: 80.7 fL (ref 80.0–100.0)
MCV: 80 fL (ref 80.0–100.0)
Platelets: 194 10*3/uL (ref 150–400)
Platelets: 169 10*3/uL (ref 150–400)
RBC: 4.25 MIL/uL (ref 3.87–5.11)
RBC: 4.14 MIL/uL (ref 3.87–5.11)
RDW: 14.6 % (ref 11.5–15.5)
RDW: 14.6 % (ref 11.5–15.5)
WBC: 11.4 10*3/uL — ABNORMAL HIGH (ref 4.0–10.5)
WBC: 15.3 10*3/uL — ABNORMAL HIGH (ref 4.0–10.5)
nRBC: 0 % (ref 0.0–0.2)
nRBC: 0 % (ref 0.0–0.2)

## 2023-06-05 LAB — HIV ANTIBODY (ROUTINE TESTING W REFLEX): HIV Screen 4th Generation wRfx: NONREACTIVE

## 2023-06-05 MED ORDER — ONDANSETRON HCL 4 MG/2ML IJ SOLN
4.0000 mg | Freq: Four times a day (QID) | INTRAMUSCULAR | Status: DC | PRN
Start: 2023-06-05 — End: 2023-06-05

## 2023-06-05 MED ORDER — TRANEXAMIC ACID-NACL 1000-0.7 MG/100ML-% IV SOLN
1000.0000 mg | INTRAVENOUS | Status: AC
  Administered 2023-06-05: 1000 mg via INTRAVENOUS

## 2023-06-05 MED ORDER — OXYTOCIN BOLUS FROM INFUSION
333.0000 mL | Freq: Once | INTRAVENOUS | Status: AC
  Administered 2023-06-05: 333 mL via INTRAVENOUS

## 2023-06-05 MED ORDER — DIPHENHYDRAMINE HCL 25 MG PO CAPS: 25.0000 mg | ORAL_CAPSULE | Freq: Four times a day (QID) | ORAL | Status: DC | PRN

## 2023-06-05 MED ORDER — LACTATED RINGERS IV SOLN: INTRAVENOUS | Status: DC

## 2023-06-05 MED ORDER — FENTANYL CITRATE (PF) 100 MCG/2ML IJ SOLN: 50.0000 ug | INTRAMUSCULAR | Status: DC | PRN

## 2023-06-05 MED ORDER — EPHEDRINE 5 MG/ML INJ: 10.0000 mg | INTRAVENOUS | Status: DC | PRN

## 2023-06-05 MED ORDER — ACETAMINOPHEN 325 MG PO TABS: 650.0000 mg | ORAL_TABLET | ORAL | Status: DC | PRN

## 2023-06-05 MED ORDER — SENNOSIDES-DOCUSATE SODIUM 8.6-50 MG PO TABS
2.0000 | ORAL_TABLET | Freq: Every day | ORAL | Status: DC
  Filled 2023-06-05: qty 2

## 2023-06-05 MED ORDER — ONDANSETRON HCL 4 MG PO TABS: 4.0000 mg | ORAL_TABLET | ORAL | Status: DC | PRN

## 2023-06-05 MED ORDER — WITCH HAZEL-GLYCERIN EX PADS: 1.0000 | MEDICATED_PAD | CUTANEOUS | Status: DC | PRN

## 2023-06-05 MED ORDER — SIMETHICONE 80 MG PO CHEW: 80.0000 mg | CHEWABLE_TABLET | ORAL | Status: DC | PRN

## 2023-06-05 MED ORDER — LACTATED RINGERS IV SOLN: 500.0000 mL | Freq: Once | INTRAVENOUS | Status: DC

## 2023-06-05 MED ORDER — FENTANYL-BUPIVACAINE-NACL 0.5-0.125-0.9 MG/250ML-% EP SOLN
EPIDURAL | Status: AC
  Filled 2023-06-05: qty 250

## 2023-06-05 MED ORDER — COCONUT OIL OIL: 1.0000 | TOPICAL_OIL | Status: DC | PRN

## 2023-06-05 MED ORDER — OXYCODONE-ACETAMINOPHEN 5-325 MG PO TABS: 1.0000 | ORAL_TABLET | ORAL | Status: DC | PRN

## 2023-06-05 MED ORDER — PRENATAL MULTIVITAMIN CH
1.0000 | ORAL_TABLET | Freq: Every day | ORAL | Status: DC
  Administered 2023-06-05 – 2023-06-06 (×2): 1 via ORAL
  Filled 2023-06-05 (×2): qty 1

## 2023-06-05 MED ORDER — SOD CITRATE-CITRIC ACID 500-334 MG/5ML PO SOLN: 30.0000 mL | ORAL | Status: DC | PRN

## 2023-06-05 MED ORDER — TETANUS-DIPHTH-ACELL PERTUSSIS 5-2.5-18.5 LF-MCG/0.5 IM SUSY: 0.5000 mL | PREFILLED_SYRINGE | Freq: Once | INTRAMUSCULAR | Status: DC

## 2023-06-05 MED ORDER — DIBUCAINE (PERIANAL) 1 % EX OINT: 1.0000 | TOPICAL_OINTMENT | CUTANEOUS | Status: DC | PRN

## 2023-06-05 MED ORDER — LACTATED RINGERS IV SOLN: 500.0000 mL | INTRAVENOUS | Status: DC | PRN

## 2023-06-05 MED ORDER — PHENYLEPHRINE 80 MCG/ML (10ML) SYRINGE FOR IV PUSH (FOR BLOOD PRESSURE SUPPORT)
80.0000 ug | PREFILLED_SYRINGE | INTRAVENOUS | Status: DC | PRN
Start: 2023-06-05 — End: 2023-06-05

## 2023-06-05 MED ORDER — ONDANSETRON HCL 4 MG/2ML IJ SOLN: 4.0000 mg | INTRAMUSCULAR | Status: DC | PRN

## 2023-06-05 MED ORDER — TRANEXAMIC ACID-NACL 1000-0.7 MG/100ML-% IV SOLN
INTRAVENOUS | Status: AC
  Filled 2023-06-05: qty 100

## 2023-06-05 MED ORDER — DIPHENHYDRAMINE HCL 50 MG/ML IJ SOLN: 12.5000 mg | INTRAMUSCULAR | Status: DC | PRN

## 2023-06-05 MED ORDER — OXYCODONE-ACETAMINOPHEN 5-325 MG PO TABS: 2.0000 | ORAL_TABLET | ORAL | Status: DC | PRN

## 2023-06-05 MED ORDER — FERROUS SULFATE 325 (65 FE) MG PO TABS
325.0000 mg | ORAL_TABLET | Freq: Two times a day (BID) | ORAL | Status: DC
  Administered 2023-06-05 – 2023-06-06 (×3): 325 mg via ORAL
  Filled 2023-06-05 (×3): qty 1

## 2023-06-05 MED ORDER — BENZOCAINE-MENTHOL 20-0.5 % EX AERO: 1.0000 | INHALATION_SPRAY | CUTANEOUS | Status: DC | PRN

## 2023-06-05 MED ORDER — LIDOCAINE HCL (PF) 1 % IJ SOLN
30.0000 mL | INTRAMUSCULAR | Status: AC | PRN
  Administered 2023-06-05: 30 mL via SUBCUTANEOUS
  Filled 2023-06-05: qty 30

## 2023-06-05 MED ORDER — OXYTOCIN-SODIUM CHLORIDE 30-0.9 UT/500ML-% IV SOLN
2.5000 [IU]/h | INTRAVENOUS | Status: DC
  Filled 2023-06-05: qty 500

## 2023-06-05 MED ORDER — ZOLPIDEM TARTRATE 5 MG PO TABS: 5.0000 mg | ORAL_TABLET | Freq: Every evening | ORAL | Status: DC | PRN

## 2023-06-05 MED ORDER — IBUPROFEN 600 MG PO TABS
600.0000 mg | ORAL_TABLET | Freq: Four times a day (QID) | ORAL | Status: DC
  Administered 2023-06-05 – 2023-06-06 (×5): 600 mg via ORAL
  Filled 2023-06-05 (×5): qty 1

## 2023-06-05 MED ORDER — FENTANYL-BUPIVACAINE-NACL 0.5-0.125-0.9 MG/250ML-% EP SOLN: 12.0000 mL/h | EPIDURAL | Status: DC | PRN

## 2023-06-05 NOTE — Anesthesia Preprocedure Evaluation (Signed)
Anesthesia Evaluation  Patient identified by MRN, date of birth, ID band Patient awake    Reviewed: Allergy & Precautions, NPO status , Patient's Chart, lab work & pertinent test results  History of Anesthesia Complications Negative for: history of anesthetic complications  Airway Mallampati: III  TM Distance: >3 FB Neck ROM: Full    Dental   Pulmonary neg pulmonary ROS   Pulmonary exam normal breath sounds clear to auscultation       Cardiovascular negative cardio ROS  Rhythm:Regular Rate:Normal     Neuro/Psych negative neurological ROS     GI/Hepatic negative GI ROS, Neg liver ROS,,,  Endo/Other  negative endocrine ROS    Renal/GU negative Renal ROS     Musculoskeletal   Abdominal   Peds  Hematology  (+) Blood dyscrasia, anemia Lab Results      Component                Value               Date                      WBC                      11.4 (H)            06/05/2023                HGB                      10.8 (L)            06/05/2023                HCT                      34.3 (L)            06/05/2023                MCV                      80.7                06/05/2023                PLT                      194                 06/05/2023              Anesthesia Other Findings H/o HELLP syndrome in previous pregnancy  Reproductive/Obstetrics (+) Pregnancy                              Anesthesia Physical Anesthesia Plan  ASA: 2  Anesthesia Plan: Epidural   Post-op Pain Management:    Induction:   PONV Risk Score and Plan:   Airway Management Planned:   Additional Equipment:   Intra-op Plan:   Post-operative Plan:   Informed Consent: I have reviewed the patients History and Physical, chart, labs and discussed the procedure including the risks, benefits and alternatives for the proposed anesthesia with the patient or authorized representative who has indicated  his/her understanding and acceptance.       Plan Discussed with: Anesthesiologist  Anesthesia Plan Comments: (  I have discussed risks of neuraxial anesthesia including but not limited to infection, bleeding, nerve injury, back pain, headache, seizures, and failure of block. Patient denies bleeding disorders and is not currently anticoagulated. Labs have been reviewed. Risks and benefits discussed. All patient's questions answered.  )         Anesthesia Quick Evaluation

## 2023-06-05 NOTE — MAU Note (Signed)
Brenda Mason is a 27 y.o. at [redacted]w[redacted]d here in MAU reporting: ctx since 2330. Pt states they are every 3-4 minutes. Pt denies VB or LOF. +FM   Onset of complaint: 2330 Pain score: 7/10 lower back  7/10 lower abdomen  Vitals:   06/05/23 0329 06/05/23 0330  BP: 129/70 129/70  Pulse: 91 91  Resp: 18   Temp: 97.9 F (36.6 C)      FHT:141 Lab orders placed from triage:

## 2023-06-05 NOTE — Lactation Note (Signed)
This note was copied from a baby's chart. Lactation Consultation Note  Patient Name: Brenda Mason [id number] Date: 06/05/2023 Age:25 hours Reason for consult: Follow-up assessment;Early term 37-38.6wks  P2, 38 wks. Infant asleep on moms chest. Mom shares previous experiences with breastfeeding/pumping,  per mom " this baby latching well" since birth. Encouraged starting feeds with hand expression, a sleepy baby in first 24 hours that may need to be reminded to eat every 3 hours, and belly size in first days of life. Discussed using different pumps this time, highlighted prevention/treatments for engorgement and mastitis. Handouts highlighted lactation O/P, help-line, support group/classes as well as milk storage times. Mom receptive. Highlighted easier transition to milk production with second baby. Encouraged mom to call when [facility] assist desired.   Maternal Data Has patient been taught Hand Expression?: Yes Does the patient have breastfeeding experience prior to this delivery?: Yes How long did the patient breastfeed?: 7 wks- returned to work, changed to wearable pumping, got mastitis x 3 occassions  Feeding Mother's Current Feeding Choice: Breast Milk     Interventions Interventions: Breast feeding basics reviewed;Hand express;Education;Guidelines for Milk Supply and Pumping Schedule Handout;LC Services brochure  Discharge Pump: DEBP (Mom has both spectra and medela)  Consult Status Consult Status: Follow-up Date: 06/05/23    Idamae Lusher 06/05/2023, 8:47 AM

## 2023-06-05 NOTE — H&P (Signed)
Brenda Mason is a 27 y.o. female G3P1101 at [redacted]w[redacted]d presenting for labor.  CTX started around 1130 pm last night and have intensified.  Antepartum course complicated by h/o HELLP Syndrome in first pregnancy with IOL at 22 weeks (IUFD).  Patient had GHTN in last pregnancy and has been normotensive during this pregnancy; LDA x 2.  GBS negative.  Low risk NIPS.    OB History     Gravida  3   Para  2   Term  1   Preterm  1   AB  0   Living  1      SAB  0   IAB  0   Ectopic  0   Multiple  0   Live Births  1          Past Medical History:  Diagnosis Date   Chicken pox    Concussion    HELLP (hemolytic anemia/elev liver enzymes/low platelets in pregnancy)    Pregnancy induced hypertension    Past Surgical History:  Procedure Laterality Date   ADENOIDECTOMY  2005   TONSILLECTOMY     TONSILLECTOMY  2005   Family History: family history includes Cancer in her maternal grandmother; Healthy in her mother; Hypertension in her father. Social History:  reports that she has never smoked. She has never used smokeless tobacco. She reports that she does not drink alcohol and does not use drugs.     Maternal Diabetes: No Genetic Screening: Normal Maternal Ultrasounds/Referrals: Normal Fetal Ultrasounds or other Referrals:  None Maternal Substance Abuse:  No Significant Maternal Medications:  None Significant Maternal Lab Results:  Group B Strep negative Number of Prenatal Visits:greater than 3 verified prenatal visits Maternal Vaccinations:TDap Other Comments:  None  Review of Systems Maternal Medical History:  Reason for admission: Contractions.   Contractions: Onset was 3-5 hours ago.   Frequency: regular.   Perceived severity is strong.   Fetal activity: Perceived fetal activity is normal.   Last perceived fetal movement was within the past hour.   Prenatal complications: no prenatal complications Prenatal Complications - Diabetes: none.   Dilation:  10 Effacement (%): 100 Station: Plus 2 Exam by:: Dr. Langston Masker Blood pressure 118/85, pulse 74, temperature 97.9 F (36.6 C), temperature source Oral, resp. rate 18, height 5\' 5"  (1.651 m), weight 90.1 kg, last menstrual period 09/12/2022, unknown if currently breastfeeding. Maternal Exam:  Uterine Assessment: Contraction strength is firm.  Contraction frequency is regular.  Abdomen: Patient reports no abdominal tenderness. Fundal height is c/w dates.   Estimated fetal weight is 7#.   Fetal presentation: vertex Introitus: Normal vulva. Pelvis: adequate for delivery.   Cervix: Cervix evaluated by digital exam.     Physical Exam Constitutional:      Appearance: Normal appearance.  HENT:     Head: Normocephalic and atraumatic.  Pulmonary:     Effort: Pulmonary effort is normal.  Abdominal:     Palpations: Abdomen is soft.  Genitourinary:    General: Normal vulva.  Musculoskeletal:        General: Normal range of motion.     Cervical back: Normal range of motion.  Skin:    General: Skin is warm and dry.  Neurological:     Mental Status: She is alert and oriented to person, place, and time.  Psychiatric:        Mood and Affect: Mood normal.        Behavior: Behavior normal.     Prenatal labs: ABO, Rh: --/--/  B POS (11/06 0340) Antibody: NEG (11/06 0340) Rubella:  Immune RPR:   NR HBsAg:   Negative HIV:   NR GBS: Negative/-- (11/01 0000)   Assessment/Plan: 56UJ [id number] at [redacted]w[redacted]d with labor -Patient arrived to L&D and progressed rapidly with precipitous delivery -See delivery note for detail   Mitchel Honour 06/05/2023, 4:48 AM

## 2023-06-06 NOTE — Discharge Summary (Signed)
Postpartum Discharge Summary  Date of Service updated 06/06/23     Patient Name: Brenda Mason DOB: 1996/03/19 MRN: [id number]  Date of admission: 06/05/2023 Delivery date:06/05/2023 Delivering provider: Mitchel Honour Date of discharge: 06/06/2023  Admitting diagnosis: Pregnancy [Z34.90] Intrauterine pregnancy: [redacted]w[redacted]d     Secondary diagnosis:  Principal Problem:   Pregnancy  Additional problems: None    Discharge diagnosis: Term Pregnancy Delivered                                              Post partum procedures: None Augmentation: N/A Complications: None  Hospital course: Onset of Labor With Vaginal Delivery      27 y.o. yo [id number] at [redacted]w[redacted]d was admitted in Active Labor on 06/05/2023. Labor course was complicated by precipitous labor.  Membrane Rupture Time/Date: 4:05 AM,06/05/2023  Delivery Method:Vaginal, Spontaneous Operative Delivery:N/A Episiotomy: None Lacerations:  2nd degree Patient had a postpartum course complicated by none.  She is ambulating, tolerating a regular diet, passing flatus, and urinating well. Patient is discharged home in stable condition on 06/06/23.  Newborn Data: Birth date:06/05/2023 Birth time:4:15 AM Gender:Female Living status:Living Apgars:9 ,9  Weight:2800 g  Magnesium Sulfate received: No BMZ received: No Rhophylac:N/A MMR:N/A T-DaP:Given prenatally Flu: N/A Transfusion:No Immunizations administered: There is no immunization history for the selected administration types on file for this patient.  Physical exam  Vitals:   06/05/23 0730 06/05/23 1500 06/05/23 2136 06/06/23 0527  BP: 119/77 122/69 127/69 113/70  Pulse: 77 81 81 85  Resp: 18  18 18   Temp:   98.1 F (36.7 C) 98.2 F (36.8 C)  TempSrc:   Oral Oral  SpO2:   99% 100%  Weight:      Height:       General: alert Lochia: appropriate Uterine Fundus: firm Incision: N/A DVT Evaluation: No evidence of DVT seen on physical exam. Labs: Lab Results  Component Value  Date   WBC 15.3 (H) 06/05/2023   HGB 10.3 (L) 06/05/2023   HCT 33.1 (L) 06/05/2023   MCV 80.0 06/05/2023   PLT 169 06/05/2023      Latest Ref Rng & Units 03/01/2022   12:07 AM  CMP  Glucose 70 - 99 mg/dL 94   BUN 6 - 20 mg/dL 6   Creatinine 0.86 - 5.78 mg/dL 4.69   Sodium 629 - 528 mmol/L 136   Potassium 3.5 - 5.1 mmol/L 3.7   Chloride 98 - 111 mmol/L 107   CO2 22 - 32 mmol/L 20   Calcium 8.9 - 10.3 mg/dL 8.9   Total Protein 6.5 - 8.1 g/dL 5.7   Total Bilirubin 0.3 - 1.2 mg/dL 0.6   Alkaline Phos 38 - 126 U/L 206   AST 15 - 41 U/L 25   ALT 0 - 44 U/L 13    Edinburgh Score:    06/06/2023    7:35 AM  Edinburgh Postnatal Depression Scale Screening Tool  I have been able to laugh and see the funny side of things. 0  I have looked forward with enjoyment to things. 0  I have blamed myself unnecessarily when things went wrong. 1  I have been anxious or worried for no good reason. 2  I have felt scared or panicky for no good reason. 2  Things have been getting on top of me. 0  I have been so  unhappy that I have had difficulty sleeping. 0  I have felt sad or miserable. 0  I have been so unhappy that I have been crying. 0  The thought of harming myself has occurred to me. 0  Edinburgh Postnatal Depression Scale Total 5      After visit meds:  Allergies as of 06/06/2023   No Known Allergies      Medication List     TAKE these medications    acetaminophen 325 MG tablet Commonly known as: Tylenol Take 2 tablets (650 mg total) by mouth every 4 (four) hours as needed (for pain scale < 4).   ferrous sulfate 325 (65 FE) MG tablet Take 325 mg by mouth daily.   prenatal multivitamin Tabs tablet Take 1 tablet by mouth daily at 12 noon.         Discharge home in stable condition Infant Feeding: Bottle and Breast Infant Disposition:home with mother Discharge instruction: per After Visit Summary and Postpartum booklet. Activity: Advance as tolerated. Pelvic rest for 6  weeks.  Diet: routine diet Anticipated Birth Control: Unsure Postpartum Appointment:6 weeks Additional Postpartum F/U:  None. Dw pt PIH precautions. She did not have BP problems this pregnancy but has hx HELLP and PIH.  Future Appointments:No future appointments. Follow up Visit:      06/06/2023 Ranae Pila, MD

## 2023-06-19 ENCOUNTER — Telehealth (HOSPITAL_COMMUNITY): Payer: Self-pay | Admitting: *Deleted

## 2023-06-19 NOTE — Telephone Encounter (Signed)
06/19/2023  Name: Brenda Mason MRN: [id number] DOB: [birth year]  Reason for Call:  Transition of Care Hospital Discharge Call  Contact Status: Patient Contact Status: Message  Language assistant needed:          Follow-Up Questions:    Inocente Salles Postnatal Depression Scale:  In the Past 7 Days:    PHQ2-9 Depression Scale:     Discharge Follow-up:    Post-discharge interventions: NA  Salena Saner, RN 06/19/2023 14:45

## 2023-06-25 IMAGING — US US MFM OB LIMITED
1 series · 14 of 28 positions shown · non-contrast
Comparison: none

[Series 1: us mfm ob limited · 95 acquisitions, 14 frames shown]
[im 4/95]
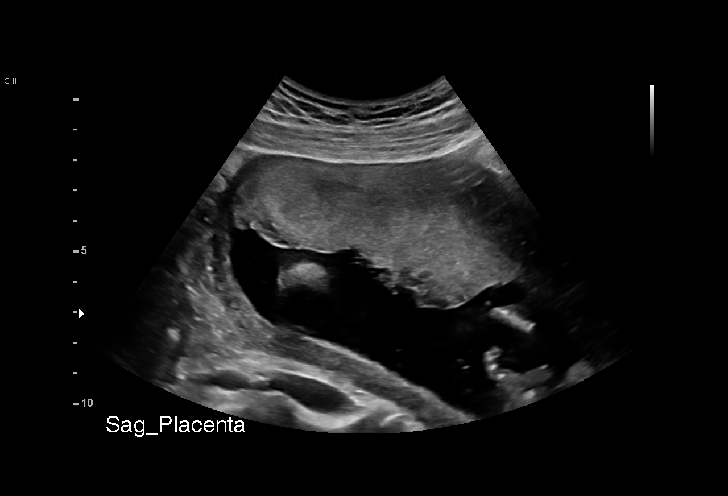
[im 11/95]
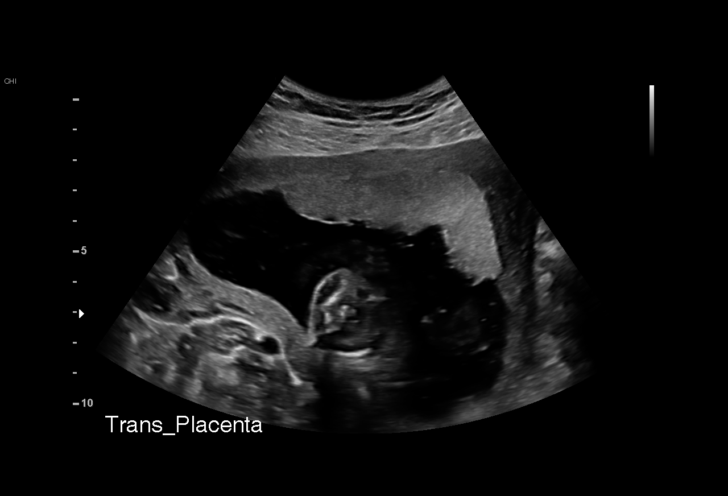
[im 18/95]
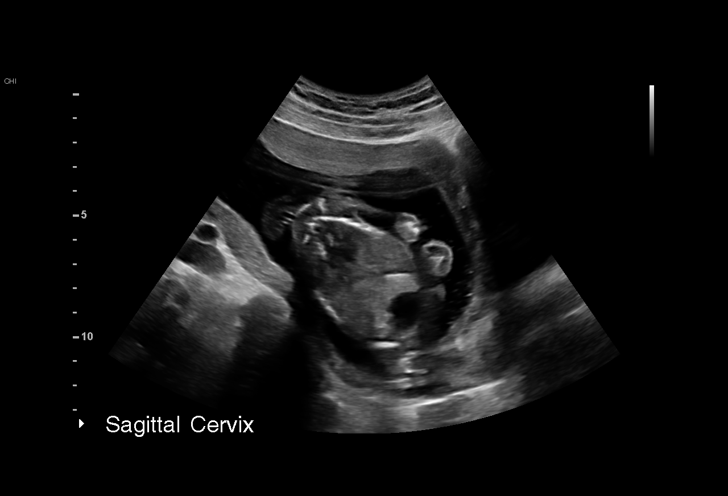
[im 25/95]
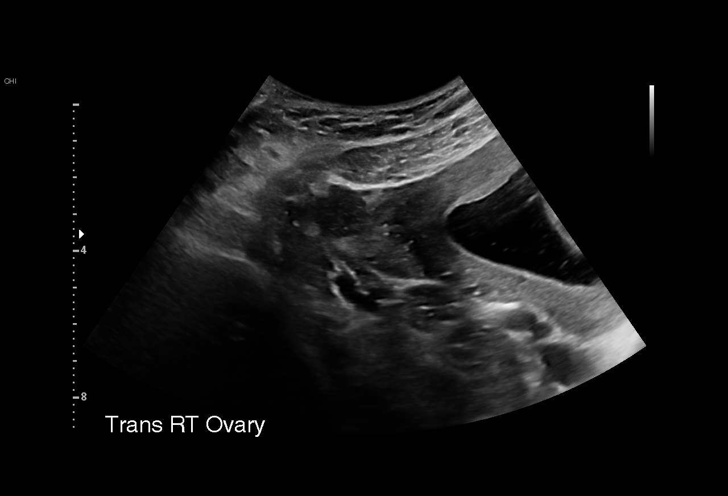
[im 32/95]
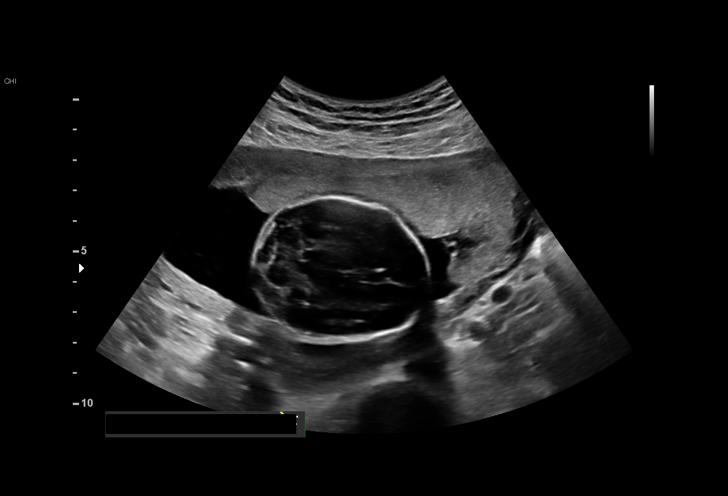
[im 39/95]
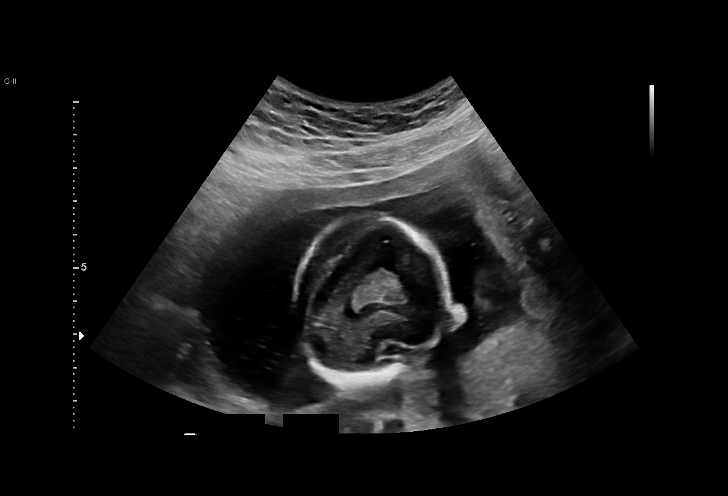
[im 46/95]
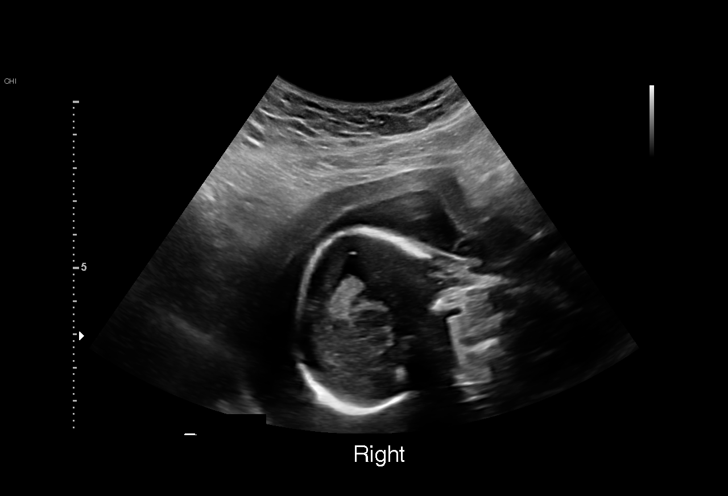
[im 53/95]
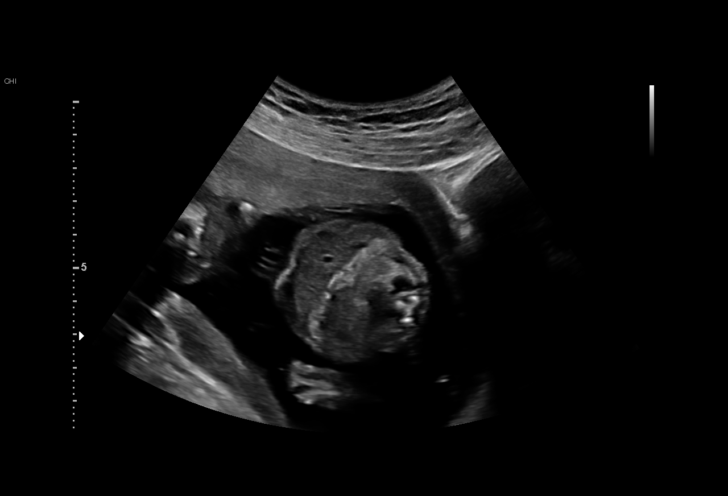
[im 60/95]
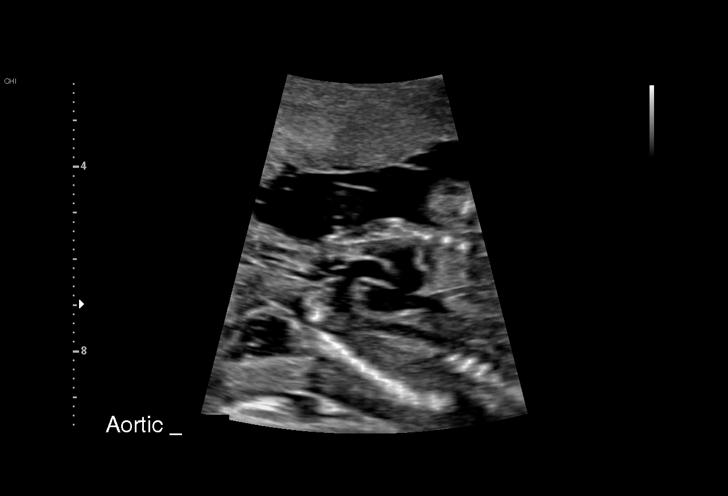
[im 67/95]
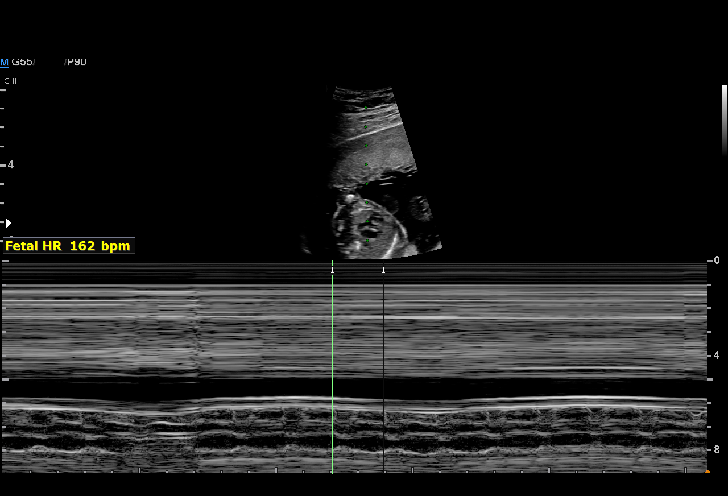
[im 74/95]
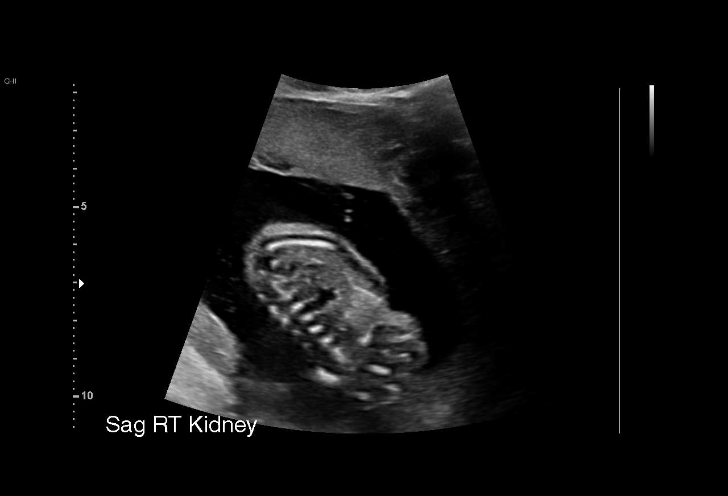
[im 81/95]
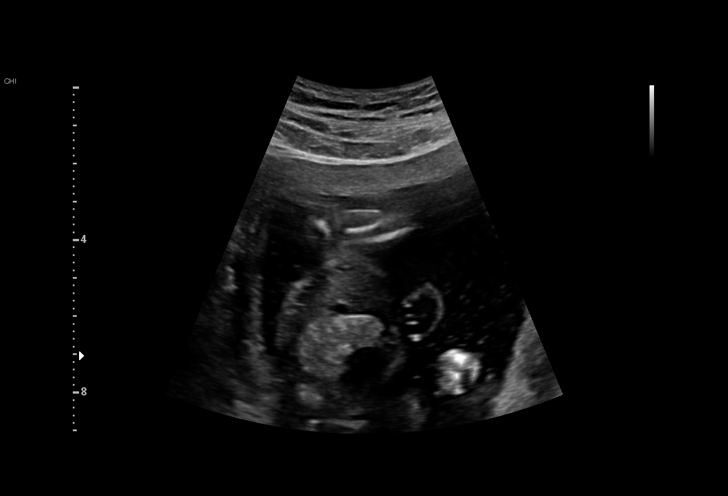
[im 88/95]
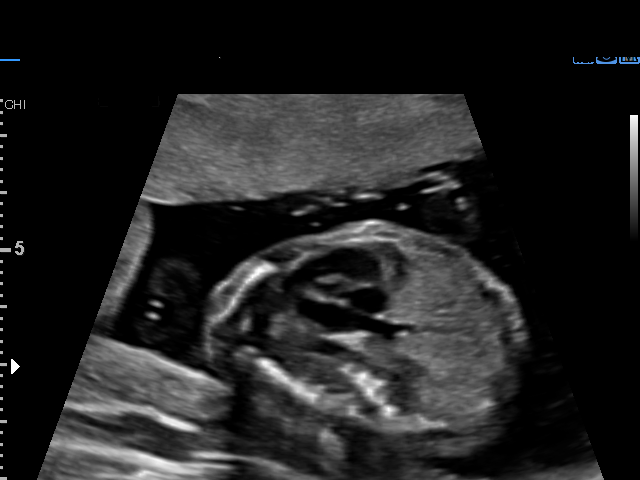
[im 95/95]
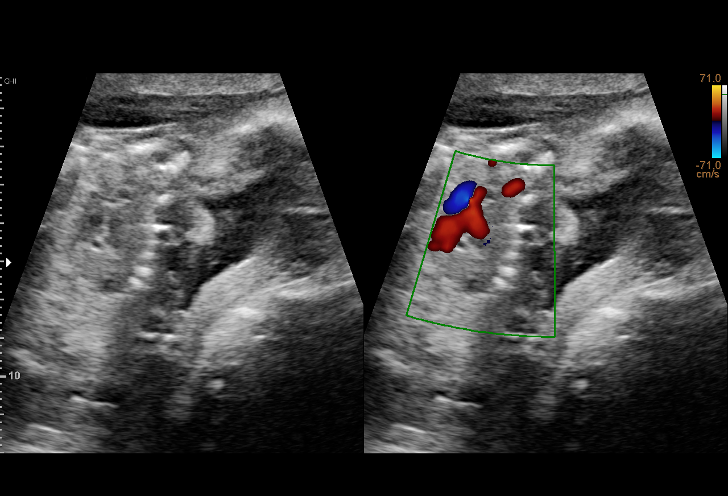

[14 of 28 positions shown; findings below may reference images not displayed]

[REDACTED]

 1  US MFM OB LIMITED                     76815.01    GIORGI
                                                      ANTOINETTE

Indications

 Maternal care for known or suspected poor
 fetal growth, second trimester, not applicable
 or unspecified IUGR
 Antenatal follow-up for nonvisualized fetal
 anatomy
 Marginal insertion of umbilical cord affecting
 management of mother in second trimester
 Echogenic bowel
 21 weeks gestation of pregnancy
Fetal Evaluation

 Num Of Fetuses:         1
 Fetal Heart Rate(bpm):  162
 Cardiac Activity:       Observed
 Presentation:           Breech
 Placenta:               Anterior
 P. Cord Insertion:      Marginal insertion

 Amniotic Fluid
 AFI FV:      Within normal limits

                             Largest Pocket(cm)

Biometry

 LV:        6.5  mm
OB History

 Gravidity:    1         Term:   0
 Living:       0
Gestational Age

 Best:          21w 5d     Det. By:  Previous Ultrasound      EDD:   07/26/21
                                     (12/05/20)
Anatomy

 Cranium:               Previously seen        Aortic Arch:            Appears normal
 Cavum:                 Previously seen        Ductal Arch:            Not well visualized
 Ventricles:            Appears normal         Diaphragm:              Previously seen
 Choroid Plexus:        Appears normal         Stomach:                Appears normal, left
                                                                       sided
 Cerebellum:            Previously seen        Abdomen:                Echogenic Bowel
 Posterior Fossa:       Previously seen        Abdominal Wall:         Previously seen
 Nuchal Fold:           Previously seen        Cord Vessels:           Previously seen
 Face:                  Orbits and profile     Kidneys:                Appear normal
                        previously seen
 Lips:                  Previously seen        Bladder:                Appears normal
 Thoracic:              Appears normal         Spine:                  Previously seen
 Heart:                 Not well visualized    Upper Extremities:      Not well visualized
 RVOT:                  Not well visualized    Lower Extremities:      Previously seen
 LVOT:                  Not well visualized
Cervix Uterus Adnexa

 Cervix
 Not adaquately visualized

 Uterus
 No abnormality visualized.

 Right Ovary
 Within normal limits.

 Left Ovary
 Within normal limits.

 Cul De Sac
 No free fluid seen.

 Adnexa
 No abnormality visualized.
Impression

 Limited exam to clear the anatomy and to provide
 reassurance to Ms. Franklin Luis.

 Ms. Franklin Luis was seen previously with early onset of FGR and
 persistent absent AEDF without REDF.

 Her labs are normal including NIPS, CF, and SMA. She is
 here to have CMV drawn.

 Suboptimal views of the fetal heart was again observed
 secondary to fetal position.

 I discussed the current status of the pregnancy and reviewed
 the differential diagnosis, evaluation and management.

 Ms. Franklin Luis declined an amniocentesis as she does not want
 the [DATE] perinatal loss risk.

 She will return on [DATE] to assess growth and UA dopplers.
 All questions were answered.
Recommendations

 Follow up growth and UA dopplers on [DATE]

## 2023-06-26 ENCOUNTER — Encounter (HOSPITAL_COMMUNITY): Payer: 59

## 2023-06-29 IMAGING — US US ABDOMEN COMPLETE
1 series · 15 of 25 positions shown · non-contrast
Comparison: None available.

CLINICAL DATA: Initial evaluation for acute mid-upper
abdominal/back pain. Currently pregnant.

EXAM:
ABDOMEN ULTRASOUND COMPLETE

[Series 1: us abdomen complete · 15 of 86 slices shown]
[im 1/86]
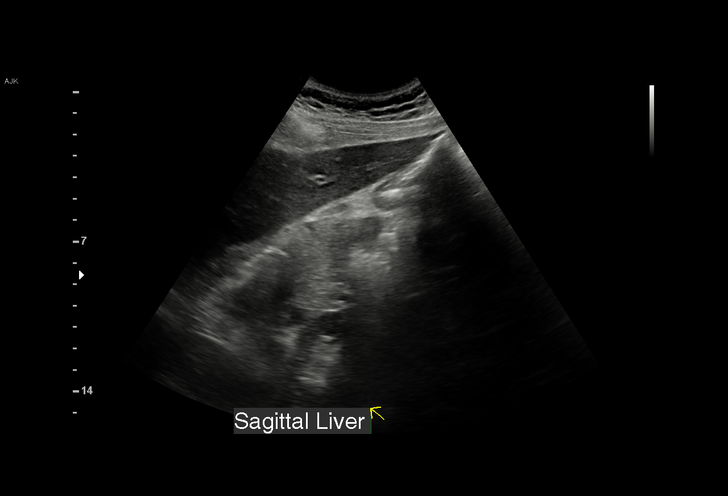
[im 8/86]
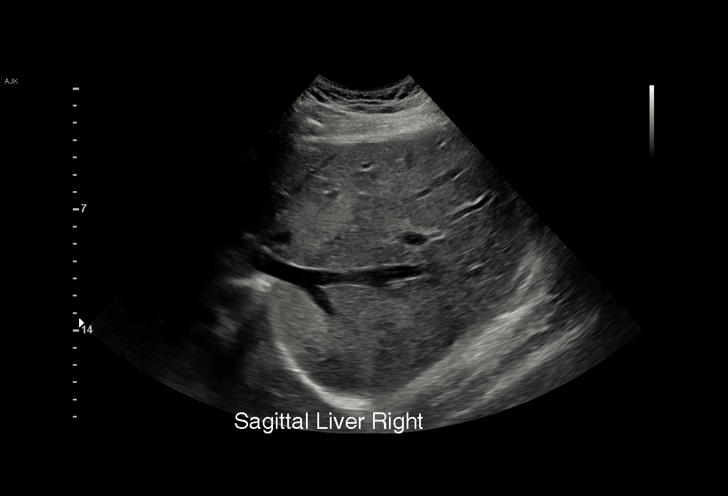
[im 15/86]
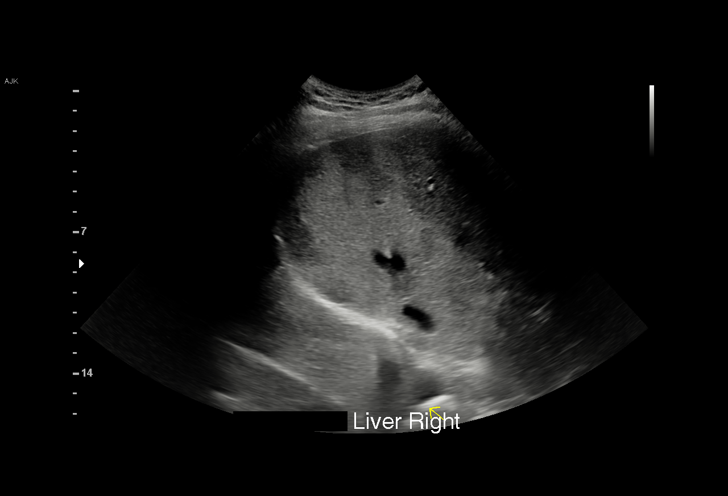
[im 18/86]
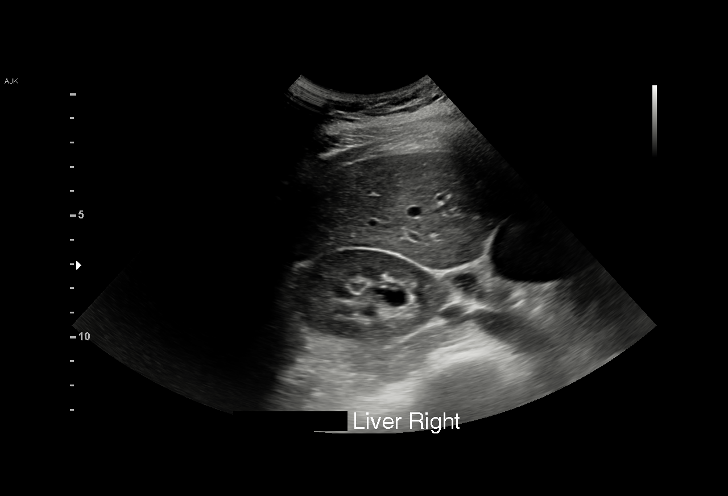
[im 25/86]
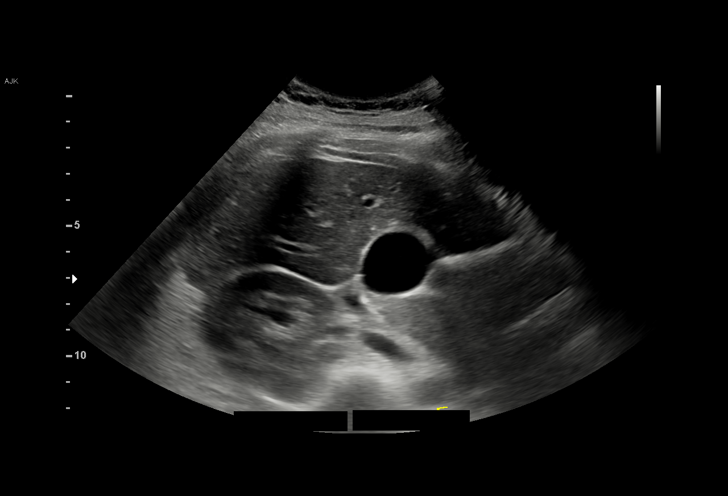
[im 32/86]
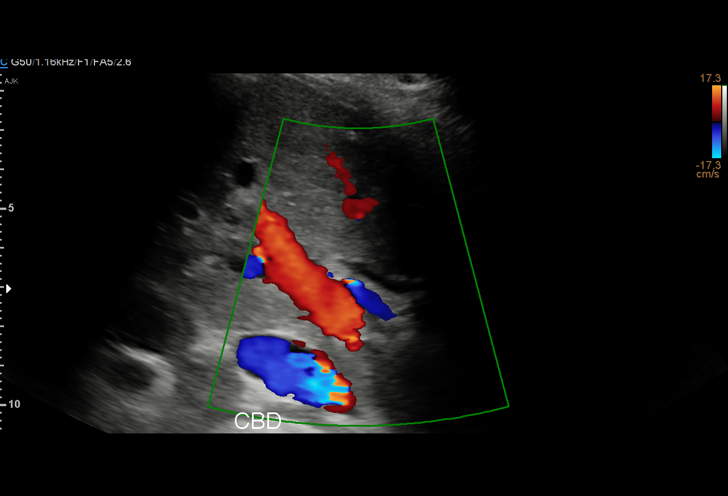
[im 36/86]
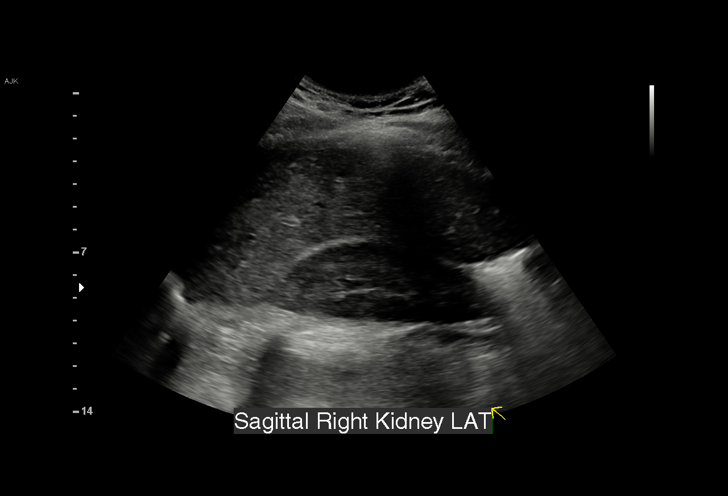
[im 43/86]
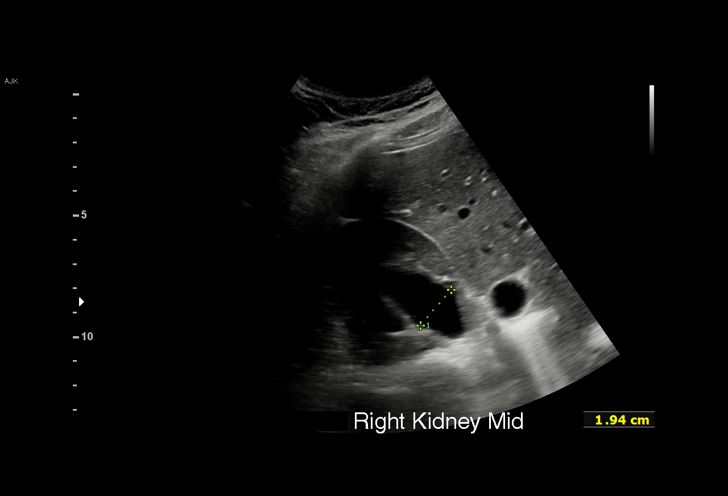
[im 50/86]
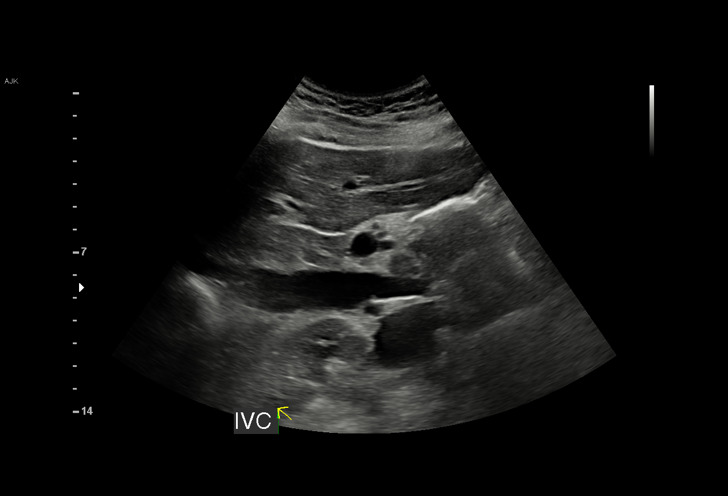
[im 54/86]
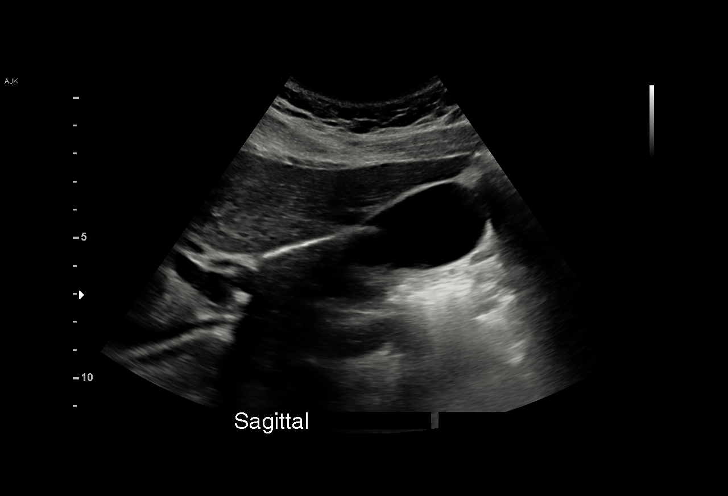
[im 61/86]
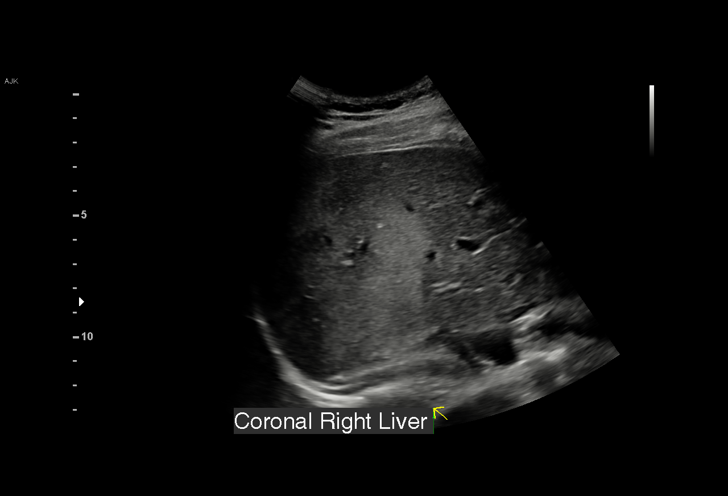
[im 68/86]
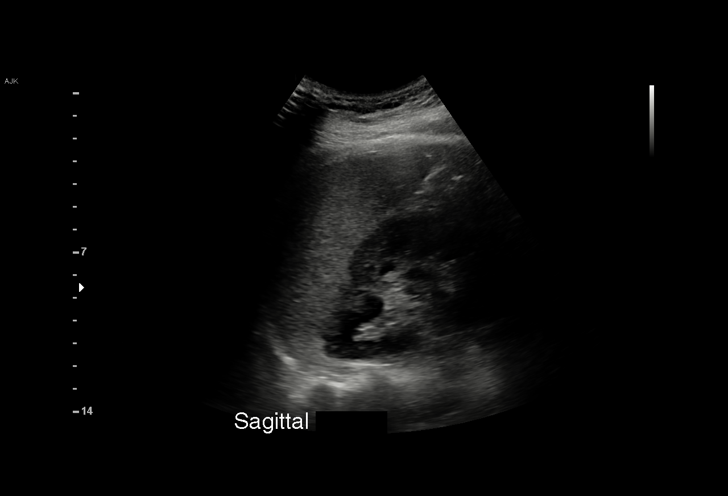
[im 71/86]
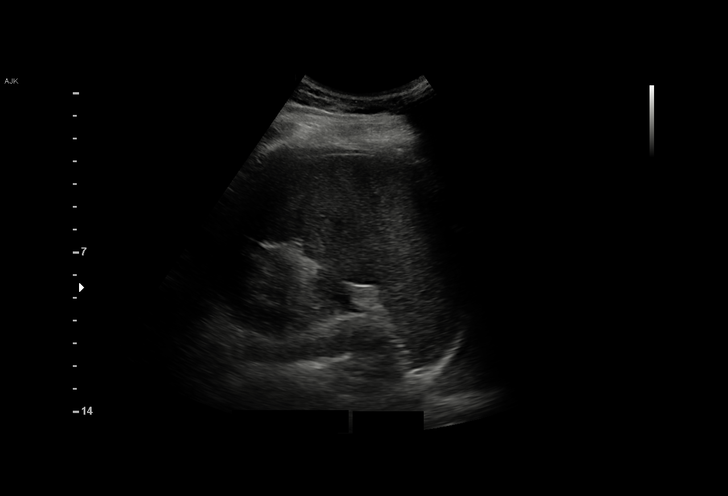
[im 78/86]
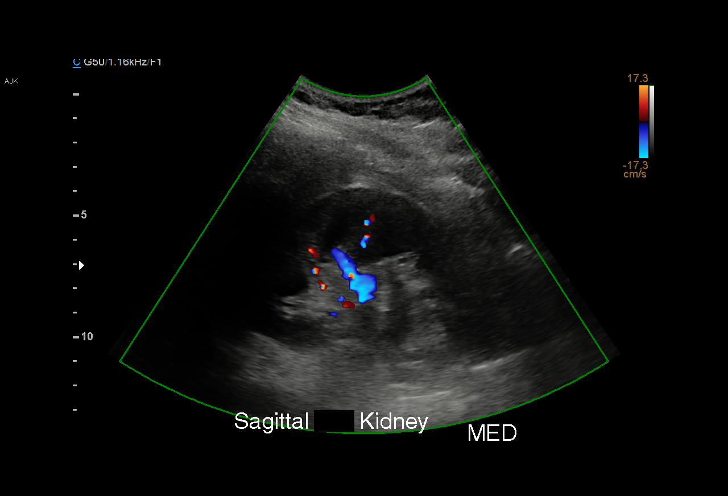
[im 86/86]
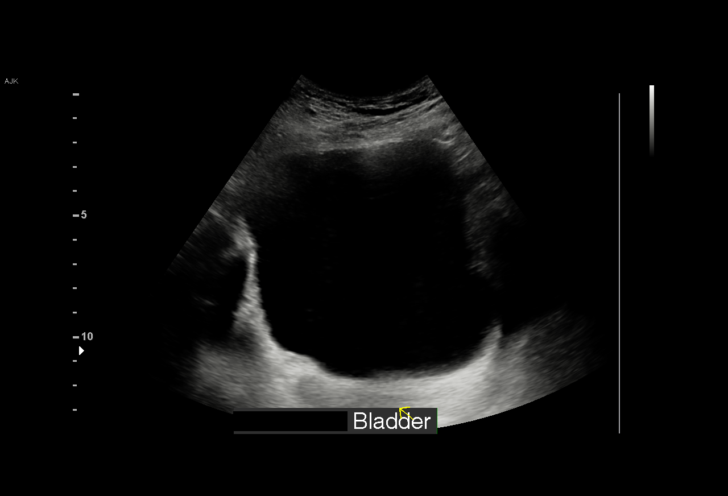

[15 of 25 positions shown; findings below may reference images not displayed]

FINDINGS: Gallbladder: No gallstones or wall thickening visualized. No
sonographic Murphy sign noted by sonographer.

Common bile duct: Diameter: 3.5 mm

Liver: No focal lesion identified. Area of diffusely increased
echogenicity within the hepatic parenchyma of the right hepatic
lobe, likely reflecting geographic fat deposition. Portal vein is
patent on color Doppler imaging with normal direction of blood flow
towards the liver.

IVC: No abnormality visualized.

Pancreas: Visualized portion unremarkable.

Spleen: Size and appearance within normal limits.

Right Kidney: Length: 12.4 cm. Renal echogenicity within normal
limits. Mild right-sided hydronephrosis, likely related to current
pregnancy. No visible nephrolithiasis. No focal renal mass.

Left Kidney: Length: 12.4 cm. Renal echogenicity within normal
limits. No nephrolithiasis or hydronephrosis. No focal renal mass.

Abdominal aorta: No aneurysm visualized.

Other findings: None.
IMPRESSION: 1. Mild right-sided hydronephrosis, likely related to current
pregnancy. No visible nephrolithiasis.
2. Diffuse hyperechoic echotexture within the right hepatic lobe,
nonspecific, but favored to reflect geographic fat deposition.
3. Otherwise unremarkable and normal abdominal ultrasound. No other
acute abnormality.

## 2023-10-25 ENCOUNTER — Encounter (HOSPITAL_BASED_OUTPATIENT_CLINIC_OR_DEPARTMENT_OTHER): Payer: Self-pay

## 2023-10-25 ENCOUNTER — Emergency Department (HOSPITAL_BASED_OUTPATIENT_CLINIC_OR_DEPARTMENT_OTHER)
Admission: EM | Admit: 2023-10-25 | Discharge: 2023-10-25 | Disposition: A | Discharge status: Home / Self Care | Attending: Emergency Medicine | Admitting: Emergency Medicine

## 2023-10-25 ENCOUNTER — Other Ambulatory Visit: Payer: Self-pay

## 2023-10-25 DIAGNOSIS — R55 Syncope and collapse: Secondary | ICD-10-CM | POA: Diagnosis not present

## 2023-10-25 DIAGNOSIS — E860 Dehydration: Secondary | ICD-10-CM

## 2023-10-25 DIAGNOSIS — R197 Diarrhea, unspecified: Secondary | ICD-10-CM | POA: Diagnosis present

## 2023-10-25 LAB — URINALYSIS, ROUTINE W REFLEX MICROSCOPIC
Bilirubin Urine: NEGATIVE
Glucose, UA: NEGATIVE mg/dL
Hgb urine dipstick: NEGATIVE
Leukocytes,Ua: NEGATIVE
Nitrite: NEGATIVE
Specific Gravity, Urine: 1.028 (ref 1.005–1.030)
pH: 5.5 (ref 5.0–8.0)

## 2023-10-25 LAB — MAGNESIUM: Magnesium: 1.6 mg/dL — ABNORMAL LOW (ref 1.7–2.4)

## 2023-10-25 LAB — COMPREHENSIVE METABOLIC PANEL WITH GFR
ALT: 12 U/L (ref 0–44)
AST: 19 U/L (ref 15–41)
Albumin: 4.8 g/dL (ref 3.5–5.0)
Alkaline Phosphatase: 91 U/L (ref 38–126)
Anion gap: 10 (ref 5–15)
BUN: 12 mg/dL (ref 6–20)
CO2: 23 mmol/L (ref 22–32)
Calcium: 9.1 mg/dL (ref 8.9–10.3)
Chloride: 103 mmol/L (ref 98–111)
Creatinine, Ser: 0.86 mg/dL (ref 0.44–1.00)
GFR, Estimated: >60 mL/min (ref >60)
Glucose, Bld: 114 mg/dL — ABNORMAL HIGH (ref 70–99)
Potassium: 3.5 mmol/L (ref 3.5–5.1)
Sodium: 136 mmol/L (ref 135–145)
Total Bilirubin: 1.2 mg/dL (ref 0.0–1.2)
Total Protein: 7.2 g/dL (ref 6.5–8.1)

## 2023-10-25 LAB — CBC
HCT: 40.2 % (ref 36.0–46.0)
Hemoglobin: 13.4 g/dL (ref 12.0–15.0)
MCH: 28.2 pg (ref 26.0–34.0)
MCHC: 33.3 g/dL (ref 30.0–36.0)
MCV: 84.5 fL (ref 80.0–100.0)
Platelets: 205 10*3/uL (ref 150–400)
RBC: 4.76 MIL/uL (ref 3.87–5.11)
RDW: 12.9 % (ref 11.5–15.5)
WBC: 9.7 10*3/uL (ref 4.0–10.5)
nRBC: 0 % (ref 0.0–0.2)

## 2023-10-25 LAB — LIPASE, BLOOD: Lipase: 32 U/L (ref 11–51)

## 2023-10-25 LAB — PREGNANCY, URINE: Preg Test, Ur: NEGATIVE

## 2023-10-25 MED ORDER — DIPHENHYDRAMINE HCL 50 MG/ML IJ SOLN
25.0000 mg | Freq: Once | INTRAMUSCULAR | Status: AC
  Administered 2023-10-25: 25 mg via INTRAVENOUS
  Filled 2023-10-25: qty 1

## 2023-10-25 MED ORDER — ONDANSETRON HCL 4 MG/2ML IJ SOLN
4.0000 mg | Freq: Once | INTRAMUSCULAR | Status: AC
  Administered 2023-10-25: 4 mg via INTRAVENOUS
  Filled 2023-10-25: qty 2

## 2023-10-25 MED ORDER — PROCHLORPERAZINE EDISYLATE 10 MG/2ML IJ SOLN
10.0000 mg | Freq: Once | INTRAMUSCULAR | Status: AC
  Administered 2023-10-25: 10 mg via INTRAVENOUS
  Filled 2023-10-25: qty 2

## 2023-10-25 MED ORDER — SODIUM CHLORIDE 0.9 % IV BOLUS
1000.0000 mL | Freq: Once | INTRAVENOUS | Status: AC
  Administered 2023-10-25: 1000 mL via INTRAVENOUS

## 2023-10-25 MED ORDER — PROMETHAZINE HCL 25 MG PO TABS: 25.0000 mg | ORAL_TABLET | Freq: Four times a day (QID) | ORAL | 0 refills | Status: AC | PRN

## 2023-10-25 MED ORDER — FAMOTIDINE IN NACL 20-0.9 MG/50ML-% IV SOLN
20.0000 mg | Freq: Once | INTRAVENOUS | Status: AC
  Administered 2023-10-25: 20 mg via INTRAVENOUS
  Filled 2023-10-25: qty 50

## 2023-10-25 NOTE — ED Provider Notes (Signed)
 [facility] Emergency Department Provider Note MRN:  [id number]  Arrival date & time: 10/25/23     Chief Complaint   Diarrhea   History of Present Illness   Brenda Mason is a 28 y.o. year-old female with no pertinent past medical history presenting to the ED with chief complaint of diarrhea.  Persistent diarrhea for the past 48 hours, now starting to have nausea and vomiting.  Mild abdominal cramping intermittently but not currently.  Low-grade fever this evening.  Trouble with any p.o. intake.  2 syncopal episodes this evening related to standing from a seated position.  Review of Systems  A thorough review of systems was obtained and all systems are negative except as noted in the HPI and PMH.   Patient's Health History    Past Medical History:  Diagnosis Date   Chicken pox    Concussion    HELLP (hemolytic anemia/elev liver enzymes/low platelets in pregnancy)    Pregnancy induced hypertension     Past Surgical History:  Procedure Laterality Date   ADENOIDECTOMY  2005   TONSILLECTOMY     TONSILLECTOMY  2005    Family History  Problem Relation Age of Onset   Healthy Mother    Hypertension Father    Cancer Maternal Grandmother     Social History   Socioeconomic History   Marital status: Married    Spouse name: Apolinar Junes   Number of children: Not on file   Years of education: Not on file   Highest education level: Not on file  Occupational History   Not on file  Tobacco Use   Smoking status: Never   Smokeless tobacco: Never  Vaping Use   Vaping status: Never Used  Substance and Sexual Activity   Alcohol use: Never   Drug use: No   Sexual activity: Yes    Birth control/protection: None  Other Topics Concern   Not on file  Social History Narrative   ** Merged History Encounter **       Social Drivers of Health   Financial Resource Strain: Not on file  Food Insecurity: No Food Insecurity (06/05/2023)   Hunger Vital Sign     Worried About Running Out of Food in the Last Year: Never true    Ran Out of Food in the Last Year: Never true  Transportation Needs: No Transportation Needs (06/05/2023)   PRAPARE - Administrator, Civil Service (Medical): No    Lack of Transportation (Non-Medical): No  Physical Activity: Not on file  Stress: Not on file  Social Connections: Not on file  Intimate Partner Violence: Not on file     Physical Exam   Vitals:   10/25/23 0415 10/25/23 0521  BP: (!) 111/58 (!) 102/56  Pulse: (!) 109 (!) 103  Resp: 18 16  Temp:    SpO2: 97% 99%    CONSTITUTIONAL: Well-appearing, NAD NEURO/PSYCH:  Alert and oriented x 3, no focal deficits EYES:  eyes equal and reactive ENT/NECK:  no LAD, no JVD CARDIO: Tachycardic rate, well-perfused, normal S1 and S2 PULM:  CTAB no wheezing or rhonchi GI/GU:  non-distended, non-tender MSK/SPINE:  No gross deformities, no edema SKIN:  no rash, atraumatic   *Additional and/or pertinent findings included in MDM below  Diagnostic and Interventional Summary    EKG Interpretation Date/Time:  Friday October 25 2023 03:53:54 EDT Ventricular Rate:  102 PR Interval:  149 QRS Duration:  99 QT Interval:  332 QTC Calculation: 433 R Axis:  41  Text Interpretation: Sinus tachycardia RSR' in V1 or V2, right VCD or RVH Confirmed by Kennis Carina [phone]) on 10/25/2023 5:35:19 AM       Labs Reviewed  COMPREHENSIVE METABOLIC PANEL WITH GFR - Abnormal; Notable for the following components:      Result Value   Glucose, Bld 114 (*)    All other components within normal limits  MAGNESIUM - Abnormal; Notable for the following components:   Magnesium 1.6 (*)    All other components within normal limits  URINALYSIS, ROUTINE W REFLEX MICROSCOPIC - Abnormal; Notable for the following components:   Ketones, ur TRACE (*)    Protein, ur TRACE (*)    All other components within normal limits  CBC  LIPASE, BLOOD  PREGNANCY, URINE    No orders to  display    Medications  sodium chloride 0.9 % bolus 1,000 mL (0 mLs Intravenous Stopped 10/25/23 0449)  ondansetron (ZOFRAN) injection 4 mg (4 mg Intravenous Given 10/25/23 0349)  prochlorperazine (COMPAZINE) injection 10 mg (10 mg Intravenous Given 10/25/23 0516)  diphenhydrAMINE (BENADRYL) injection 25 mg (25 mg Intravenous Given 10/25/23 0518)  sodium chloride 0.9 % bolus 1,000 mL (0 mLs Intravenous Stopped 10/25/23 0615)  famotidine (PEPCID) IVPB 20 mg premix (0 mg Intravenous Stopped 10/25/23 0551)     Procedures  /  Critical Care Procedures  ED Course and Medical Decision Making  Initial Impression and Ddx Suspect syncope related to orthostatic hypotension in the setting of dehydration.  Dehydration likely due to infectious diarrheal illness.  Other considerations include acute kidney injury, electrolyte disturbance, arrhythmia.  Abdomen is soft and nontender, currently without indication for advanced imaging.  Past medical/surgical history that increases complexity of ED encounter: None  Interpretation of Diagnostics I personally reviewed the EKG and my interpretation is as follows: Sinus rhythm  No significant blood count or electrolyte disturbance.  Patient Reassessment and Ultimate Disposition/Management     Patient doing much better after fluids, antiemetics.  Continued soft and nontender abdomen.  Specifically no McBurney's point tenderness.  With the main symptom of diarrhea suspecting a viral infectious colitis appropriate for outpatient management.  Return precautions provided.  Patient management required discussion with the following services or consulting groups:  None  Complexity of Problems Addressed Acute illness or injury that poses threat of life of bodily function  Additional Data Reviewed and Analyzed Further history obtained from: None  Additional Factors Impacting ED Encounter Risk Prescriptions  Elmer Sow. Pilar Plate, MD [facility] Emergency Medicine [facility] [email or web address]  Final Clinical Impressions(s) / ED Diagnoses     ICD-10-CM   1. Diarrhea, unspecified type  R19.7     2. Syncope, unspecified syncope type  R55     3. Dehydration  E86.0       ED Discharge Orders          Ordered    promethazine (PHENERGAN) 25 MG tablet  Every 6 hours PRN        10/25/23 6045             Discharge Instructions Discussed with and Provided to Patient:     Discharge Instructions      You were evaluated in the Emergency Department and after careful evaluation, we did not find any emergent condition requiring admission or further testing in the hospital.  Your exam/testing today is overall reassuring.  Symptoms likely due to a viral illness.  Recommend plenty of fluids and rest.  Can use the Phenergan  as needed for nausea at home.  Please return to the Emergency Department if you experience any worsening of your condition.   Thank you for allowing [place] to be a part of your care.       Sabas Sous, MD 10/25/23 [phone]

## 2023-10-25 NOTE — Discharge Instructions (Signed)
 You were evaluated in the Emergency Department and after careful evaluation, we did not find any emergent condition requiring admission or further testing in the hospital.  Your exam/testing today is overall reassuring.  Symptoms likely due to a viral illness.  Recommend plenty of fluids and rest.  Can use the Phenergan as needed for nausea at home.  Please return to the Emergency Department if you experience any worsening of your condition.   Thank you for allowing [place] to be a part of your care.

## 2023-10-25 NOTE — ED Triage Notes (Signed)
 Diarrhea non stop for 2 days with vomiting this morning.   Says she has 2 episodes of syncope.

## 2023-10-25 NOTE — ED Notes (Signed)
 Discharge instructions reviewed.   Newly prescribed medications discussed. Pharmacy verified.   Opportunity for questions and concerns provided.   Alert, oriented and escorted to husbands vehicle via wheelchair. Marland Kitchen   Displays no signs of distress.
